# Patient Record
Sex: Female | Born: 1945
Health system: Southern US, Community
[De-identification: ages and names within clinical notes are randomized; demographics above are authoritative.]

## PROBLEM LIST (undated history)

## (undated) DIAGNOSIS — D649 Anemia, unspecified: Secondary | ICD-10-CM

## (undated) DIAGNOSIS — M1990 Unspecified osteoarthritis, unspecified site: Secondary | ICD-10-CM

## (undated) DIAGNOSIS — E785 Hyperlipidemia, unspecified: Secondary | ICD-10-CM

## (undated) DIAGNOSIS — D126 Benign neoplasm of colon, unspecified: Secondary | ICD-10-CM

## (undated) HISTORY — PX: TUBAL LIGATION: SHX77

## (undated) HISTORY — DX: Benign neoplasm of colon, unspecified: D12.6

## (undated) HISTORY — DX: Anemia, unspecified: D64.9

## (undated) HISTORY — PX: WISDOM TOOTH EXTRACTION: SHX21

## (undated) HISTORY — DX: Hyperlipidemia, unspecified: E78.5

---

## 1998-08-03 ENCOUNTER — Other Ambulatory Visit: Admission: RE | Admit: 1998-08-03 | Discharge: 1998-08-03 | Payer: Self-pay | Admitting: Obstetrics and Gynecology

## 1998-10-15 ENCOUNTER — Ambulatory Visit (HOSPITAL_COMMUNITY): Admission: RE | Admit: 1998-10-15 | Discharge: 1998-10-15 | Payer: Self-pay | Admitting: Obstetrics and Gynecology

## 1998-10-15 ENCOUNTER — Encounter: Payer: Self-pay | Admitting: Obstetrics and Gynecology

## 1999-08-24 ENCOUNTER — Other Ambulatory Visit: Admission: RE | Admit: 1999-08-24 | Discharge: 1999-08-24 | Payer: Self-pay | Admitting: Obstetrics and Gynecology

## 1999-10-18 ENCOUNTER — Encounter: Payer: Self-pay | Admitting: Obstetrics and Gynecology

## 1999-10-18 ENCOUNTER — Ambulatory Visit (HOSPITAL_COMMUNITY): Admission: RE | Admit: 1999-10-18 | Discharge: 1999-10-18 | Payer: Self-pay | Admitting: Obstetrics and Gynecology

## 2000-08-25 ENCOUNTER — Other Ambulatory Visit: Admission: RE | Admit: 2000-08-25 | Discharge: 2000-08-25 | Payer: Self-pay | Admitting: Obstetrics and Gynecology

## 2000-10-19 ENCOUNTER — Encounter: Payer: Self-pay | Admitting: Obstetrics and Gynecology

## 2000-10-19 ENCOUNTER — Ambulatory Visit (HOSPITAL_COMMUNITY): Admission: RE | Admit: 2000-10-19 | Discharge: 2000-10-19 | Payer: Self-pay | Admitting: Obstetrics and Gynecology

## 2001-08-27 ENCOUNTER — Other Ambulatory Visit: Admission: RE | Admit: 2001-08-27 | Discharge: 2001-08-27 | Payer: Self-pay | Admitting: Obstetrics and Gynecology

## 2001-10-22 ENCOUNTER — Ambulatory Visit (HOSPITAL_COMMUNITY): Admission: RE | Admit: 2001-10-22 | Discharge: 2001-10-22 | Payer: Self-pay | Admitting: Obstetrics and Gynecology

## 2001-10-22 ENCOUNTER — Encounter: Payer: Self-pay | Admitting: Obstetrics and Gynecology

## 2002-08-29 ENCOUNTER — Other Ambulatory Visit: Admission: RE | Admit: 2002-08-29 | Discharge: 2002-08-29 | Payer: Self-pay | Admitting: Obstetrics and Gynecology

## 2002-10-25 ENCOUNTER — Encounter: Payer: Self-pay | Admitting: Obstetrics and Gynecology

## 2002-10-25 ENCOUNTER — Ambulatory Visit (HOSPITAL_COMMUNITY): Admission: RE | Admit: 2002-10-25 | Discharge: 2002-10-25 | Payer: Self-pay | Admitting: Obstetrics and Gynecology

## 2003-09-09 ENCOUNTER — Other Ambulatory Visit: Admission: RE | Admit: 2003-09-09 | Discharge: 2003-09-09 | Payer: Self-pay | Admitting: Obstetrics and Gynecology

## 2003-10-27 ENCOUNTER — Ambulatory Visit (HOSPITAL_COMMUNITY): Admission: RE | Admit: 2003-10-27 | Discharge: 2003-10-27 | Payer: Self-pay | Admitting: Obstetrics and Gynecology

## 2003-11-21 ENCOUNTER — Ambulatory Visit (HOSPITAL_COMMUNITY): Admission: RE | Admit: 2003-11-21 | Discharge: 2003-11-21 | Payer: Self-pay | Admitting: Obstetrics and Gynecology

## 2004-07-14 ENCOUNTER — Ambulatory Visit (HOSPITAL_COMMUNITY): Admission: RE | Admit: 2004-07-14 | Discharge: 2004-07-14 | Payer: Self-pay | Admitting: Family Medicine

## 2004-09-09 ENCOUNTER — Other Ambulatory Visit: Admission: RE | Admit: 2004-09-09 | Discharge: 2004-09-09 | Payer: Self-pay | Admitting: Obstetrics and Gynecology

## 2004-10-27 ENCOUNTER — Ambulatory Visit (HOSPITAL_COMMUNITY): Admission: RE | Admit: 2004-10-27 | Discharge: 2004-10-27 | Payer: Self-pay | Admitting: Obstetrics and Gynecology

## 2005-09-13 ENCOUNTER — Other Ambulatory Visit: Admission: RE | Admit: 2005-09-13 | Discharge: 2005-09-13 | Payer: Self-pay | Admitting: Obstetrics and Gynecology

## 2005-11-08 ENCOUNTER — Ambulatory Visit (HOSPITAL_COMMUNITY): Admission: RE | Admit: 2005-11-08 | Discharge: 2005-11-08 | Payer: Self-pay | Admitting: Obstetrics and Gynecology

## 2006-09-14 ENCOUNTER — Other Ambulatory Visit: Admission: RE | Admit: 2006-09-14 | Discharge: 2006-09-14 | Payer: Self-pay | Admitting: Obstetrics and Gynecology

## 2006-11-21 ENCOUNTER — Ambulatory Visit (HOSPITAL_COMMUNITY): Admission: RE | Admit: 2006-11-21 | Discharge: 2006-11-21 | Payer: Self-pay | Admitting: Obstetrics and Gynecology

## 2006-12-01 ENCOUNTER — Encounter: Admission: RE | Admit: 2006-12-01 | Discharge: 2006-12-01 | Payer: Self-pay | Admitting: Obstetrics and Gynecology

## 2007-10-23 ENCOUNTER — Other Ambulatory Visit: Admission: RE | Admit: 2007-10-23 | Discharge: 2007-10-23 | Payer: Self-pay | Admitting: Obstetrics and Gynecology

## 2007-12-14 ENCOUNTER — Encounter: Admission: RE | Admit: 2007-12-14 | Discharge: 2007-12-14 | Payer: Self-pay | Admitting: Obstetrics and Gynecology

## 2007-12-28 ENCOUNTER — Ambulatory Visit (HOSPITAL_COMMUNITY): Admission: RE | Admit: 2007-12-28 | Discharge: 2007-12-28 | Payer: Self-pay | Admitting: Obstetrics and Gynecology

## 2008-11-04 ENCOUNTER — Encounter: Payer: Self-pay | Admitting: Obstetrics and Gynecology

## 2008-11-04 ENCOUNTER — Other Ambulatory Visit: Admission: RE | Admit: 2008-11-04 | Discharge: 2008-11-04 | Payer: Self-pay | Admitting: Obstetrics and Gynecology

## 2008-11-04 ENCOUNTER — Ambulatory Visit: Payer: Self-pay | Admitting: Obstetrics and Gynecology

## 2008-12-26 ENCOUNTER — Encounter: Admission: RE | Admit: 2008-12-26 | Discharge: 2008-12-26 | Payer: Self-pay | Admitting: Obstetrics and Gynecology

## 2009-01-09 ENCOUNTER — Encounter: Admission: RE | Admit: 2009-01-09 | Discharge: 2009-01-09 | Payer: Self-pay | Admitting: Obstetrics and Gynecology

## 2009-06-30 ENCOUNTER — Ambulatory Visit (HOSPITAL_COMMUNITY): Admission: RE | Admit: 2009-06-30 | Discharge: 2009-06-30 | Payer: Self-pay | Admitting: Gastroenterology

## 2009-07-10 ENCOUNTER — Encounter: Admission: RE | Admit: 2009-07-10 | Discharge: 2009-07-10 | Payer: Self-pay | Admitting: Obstetrics and Gynecology

## 2009-11-17 ENCOUNTER — Ambulatory Visit: Payer: Self-pay | Admitting: Obstetrics and Gynecology

## 2009-11-17 ENCOUNTER — Other Ambulatory Visit: Admission: RE | Admit: 2009-11-17 | Discharge: 2009-11-17 | Payer: Self-pay | Admitting: Obstetrics and Gynecology

## 2009-12-15 ENCOUNTER — Encounter: Admission: RE | Admit: 2009-12-15 | Discharge: 2009-12-15 | Payer: Self-pay | Admitting: Obstetrics and Gynecology

## 2010-01-07 ENCOUNTER — Ambulatory Visit (HOSPITAL_COMMUNITY): Admission: RE | Admit: 2010-01-07 | Discharge: 2010-01-07 | Payer: Self-pay | Admitting: Obstetrics and Gynecology

## 2010-08-22 ENCOUNTER — Encounter: Payer: Self-pay | Admitting: Obstetrics and Gynecology

## 2010-08-23 ENCOUNTER — Encounter: Payer: Self-pay | Admitting: Obstetrics and Gynecology

## 2010-11-30 ENCOUNTER — Other Ambulatory Visit: Payer: Self-pay | Admitting: Obstetrics and Gynecology

## 2010-11-30 ENCOUNTER — Encounter: Payer: Self-pay | Admitting: Obstetrics and Gynecology

## 2010-11-30 DIAGNOSIS — Z1231 Encounter for screening mammogram for malignant neoplasm of breast: Secondary | ICD-10-CM

## 2010-12-20 ENCOUNTER — Ambulatory Visit
Admission: RE | Admit: 2010-12-20 | Discharge: 2010-12-20 | Disposition: A | Discharge status: Home / Self Care | Payer: 59 | Source: Ambulatory Visit | Attending: Obstetrics and Gynecology | Admitting: Obstetrics and Gynecology

## 2010-12-20 DIAGNOSIS — Z1231 Encounter for screening mammogram for malignant neoplasm of breast: Secondary | ICD-10-CM

## 2010-12-28 ENCOUNTER — Encounter (INDEPENDENT_AMBULATORY_CARE_PROVIDER_SITE_OTHER): Payer: 59 | Admitting: Obstetrics and Gynecology

## 2010-12-28 ENCOUNTER — Other Ambulatory Visit: Payer: Self-pay | Admitting: Obstetrics and Gynecology

## 2010-12-28 ENCOUNTER — Other Ambulatory Visit (HOSPITAL_COMMUNITY)
Admission: RE | Admit: 2010-12-28 | Discharge: 2010-12-28 | Disposition: A | Discharge status: Home / Self Care | Payer: 59 | Source: Ambulatory Visit | Attending: Obstetrics and Gynecology | Admitting: Obstetrics and Gynecology

## 2010-12-28 DIAGNOSIS — Z01419 Encounter for gynecological examination (general) (routine) without abnormal findings: Secondary | ICD-10-CM

## 2010-12-28 DIAGNOSIS — Z124 Encounter for screening for malignant neoplasm of cervix: Secondary | ICD-10-CM | POA: Insufficient documentation

## 2010-12-28 DIAGNOSIS — Z13220 Encounter for screening for lipoid disorders: Secondary | ICD-10-CM

## 2011-11-28 ENCOUNTER — Other Ambulatory Visit: Payer: Self-pay | Admitting: Obstetrics and Gynecology

## 2011-11-28 DIAGNOSIS — Z1231 Encounter for screening mammogram for malignant neoplasm of breast: Secondary | ICD-10-CM

## 2011-12-22 ENCOUNTER — Encounter: Payer: Self-pay | Admitting: Gynecology

## 2011-12-22 ENCOUNTER — Ambulatory Visit
Admission: RE | Admit: 2011-12-22 | Discharge: 2011-12-22 | Disposition: A | Discharge status: Home / Self Care | Payer: Medicare Other | Source: Ambulatory Visit | Attending: Obstetrics and Gynecology | Admitting: Obstetrics and Gynecology

## 2011-12-22 DIAGNOSIS — Z1231 Encounter for screening mammogram for malignant neoplasm of breast: Secondary | ICD-10-CM

## 2011-12-29 ENCOUNTER — Encounter: Payer: 59 | Admitting: Obstetrics and Gynecology

## 2011-12-30 ENCOUNTER — Encounter: Payer: Self-pay | Admitting: Obstetrics and Gynecology

## 2011-12-30 ENCOUNTER — Ambulatory Visit (INDEPENDENT_AMBULATORY_CARE_PROVIDER_SITE_OTHER): Payer: Medicare Other | Admitting: Obstetrics and Gynecology

## 2011-12-30 VITALS — BP 116/74 | Ht 61.0 in | Wt 106.0 lb

## 2011-12-30 DIAGNOSIS — Z780 Asymptomatic menopausal state: Secondary | ICD-10-CM

## 2011-12-30 DIAGNOSIS — R350 Frequency of micturition: Secondary | ICD-10-CM

## 2011-12-30 DIAGNOSIS — M8580 Other specified disorders of bone density and structure, unspecified site: Secondary | ICD-10-CM

## 2011-12-30 DIAGNOSIS — N952 Postmenopausal atrophic vaginitis: Secondary | ICD-10-CM

## 2011-12-30 DIAGNOSIS — M899 Disorder of bone, unspecified: Secondary | ICD-10-CM

## 2011-12-30 NOTE — Progress Notes (Signed)
Patient came back to see me today for further followup. For years we had her on HRT. She's been off her HRT and does have some mild symptoms but feels that she tolerate them without treatment. She is having no vaginal bleeding. She does have some mild vaginal dryness. She is having no pelvic pain. She just a urinary frequency without nocturia, urgency, or incontinence. She is up-to-date on mammograms. Her last bone density showed extremely mild osteopenia with her worst T score minus 1.3. She has had no fractures. She takes calcium and vitamin D. She has always had normal Pap smears.  ROS: 12 system review done. Pertinent positives above. No other positives.  Physical examination: Kennon Portela present. HEENT within normal limits. Neck: Thyroid not large. No masses. Supraclavicular nodes: not enlarged. Breasts: Examined in both sitting and lying  position. No skin changes and no masses. Abdomen: Soft no guarding rebound or masses or hernia. Pelvic: External: Within normal limits. BUS: Within normal limits. Vaginal:within normal limits. Poor estrogen effect. No evidence of cystocele rectocele or enterocele. Cervix: clean. Uterus: Normal size and shape. Adnexa: No masses. Rectovaginal exam: Confirmatory and negative. Extremities: Within normal limits.  Assessment: #1. Menopausal symptoms #2. Atrophic vaginitis #3. Urinary frequency #4. Low bone mass  Plan: Continue yearly mammograms. Continue periodic bone densities. Lab through PCP.

## 2012-12-10 ENCOUNTER — Other Ambulatory Visit: Payer: Self-pay

## 2012-12-10 DIAGNOSIS — Z1231 Encounter for screening mammogram for malignant neoplasm of breast: Secondary | ICD-10-CM

## 2013-01-02 ENCOUNTER — Ambulatory Visit
Admission: RE | Admit: 2013-01-02 | Discharge: 2013-01-02 | Disposition: A | Discharge status: Home / Self Care | Payer: Medicare Other | Source: Ambulatory Visit

## 2013-01-02 DIAGNOSIS — Z1231 Encounter for screening mammogram for malignant neoplasm of breast: Secondary | ICD-10-CM

## 2013-12-13 ENCOUNTER — Other Ambulatory Visit: Payer: Self-pay

## 2013-12-13 DIAGNOSIS — Z1231 Encounter for screening mammogram for malignant neoplasm of breast: Secondary | ICD-10-CM

## 2014-01-20 ENCOUNTER — Ambulatory Visit
Admission: RE | Admit: 2014-01-20 | Discharge: 2014-01-20 | Disposition: A | Discharge status: Home / Self Care | Payer: Commercial Managed Care - HMO | Source: Ambulatory Visit

## 2014-01-20 DIAGNOSIS — Z1231 Encounter for screening mammogram for malignant neoplasm of breast: Secondary | ICD-10-CM

## 2014-06-02 ENCOUNTER — Encounter: Payer: Self-pay | Admitting: Obstetrics and Gynecology

## 2014-12-22 ENCOUNTER — Other Ambulatory Visit: Payer: Self-pay

## 2014-12-22 DIAGNOSIS — Z1231 Encounter for screening mammogram for malignant neoplasm of breast: Secondary | ICD-10-CM

## 2015-01-26 ENCOUNTER — Ambulatory Visit
Admission: RE | Admit: 2015-01-26 | Discharge: 2015-01-26 | Disposition: A | Discharge status: Home / Self Care | Payer: Commercial Managed Care - HMO | Source: Ambulatory Visit

## 2015-01-26 DIAGNOSIS — Z1231 Encounter for screening mammogram for malignant neoplasm of breast: Secondary | ICD-10-CM

## 2015-04-21 ENCOUNTER — Other Ambulatory Visit (HOSPITAL_COMMUNITY)
Admission: RE | Admit: 2015-04-21 | Discharge: 2015-04-21 | Disposition: A | Discharge status: Home / Self Care | Payer: Commercial Managed Care - HMO | Source: Ambulatory Visit | Attending: Family Medicine | Admitting: Family Medicine

## 2015-04-21 ENCOUNTER — Other Ambulatory Visit: Payer: Self-pay | Admitting: Family Medicine

## 2015-04-21 DIAGNOSIS — Z124 Encounter for screening for malignant neoplasm of cervix: Secondary | ICD-10-CM | POA: Diagnosis present

## 2015-04-24 LAB — CYTOLOGY - PAP

## 2016-01-04 ENCOUNTER — Other Ambulatory Visit: Payer: Self-pay | Admitting: Family Medicine

## 2016-01-04 DIAGNOSIS — Z1231 Encounter for screening mammogram for malignant neoplasm of breast: Secondary | ICD-10-CM

## 2016-02-04 ENCOUNTER — Ambulatory Visit
Admission: RE | Admit: 2016-02-04 | Discharge: 2016-02-04 | Disposition: A | Discharge status: Home / Self Care | Payer: Commercial Managed Care - HMO | Source: Ambulatory Visit | Attending: Family Medicine | Admitting: Family Medicine

## 2016-02-04 DIAGNOSIS — Z1231 Encounter for screening mammogram for malignant neoplasm of breast: Secondary | ICD-10-CM

## 2016-02-08 ENCOUNTER — Other Ambulatory Visit: Payer: Self-pay | Admitting: Family Medicine

## 2016-02-08 DIAGNOSIS — R928 Other abnormal and inconclusive findings on diagnostic imaging of breast: Secondary | ICD-10-CM

## 2016-02-16 ENCOUNTER — Ambulatory Visit
Admission: RE | Admit: 2016-02-16 | Discharge: 2016-02-16 | Disposition: A | Discharge status: Home / Self Care | Payer: Commercial Managed Care - HMO | Source: Ambulatory Visit | Attending: Family Medicine | Admitting: Family Medicine

## 2016-02-16 DIAGNOSIS — R928 Other abnormal and inconclusive findings on diagnostic imaging of breast: Secondary | ICD-10-CM

## 2016-12-12 DIAGNOSIS — Z681 Body mass index (BMI) 19 or less, adult: Secondary | ICD-10-CM | POA: Diagnosis not present

## 2016-12-12 DIAGNOSIS — E785 Hyperlipidemia, unspecified: Secondary | ICD-10-CM | POA: Diagnosis not present

## 2017-01-31 ENCOUNTER — Other Ambulatory Visit: Payer: Self-pay | Admitting: Family Medicine

## 2017-01-31 DIAGNOSIS — Z1231 Encounter for screening mammogram for malignant neoplasm of breast: Secondary | ICD-10-CM

## 2017-02-09 ENCOUNTER — Ambulatory Visit
Admission: RE | Admit: 2017-02-09 | Discharge: 2017-02-09 | Disposition: A | Discharge status: Home / Self Care | Payer: Medicare PPO | Source: Ambulatory Visit | Attending: Family Medicine | Admitting: Family Medicine

## 2017-02-09 DIAGNOSIS — Z1231 Encounter for screening mammogram for malignant neoplasm of breast: Secondary | ICD-10-CM

## 2017-07-28 DIAGNOSIS — M25571 Pain in right ankle and joints of right foot: Secondary | ICD-10-CM | POA: Diagnosis not present

## 2017-07-28 DIAGNOSIS — M25572 Pain in left ankle and joints of left foot: Secondary | ICD-10-CM | POA: Diagnosis not present

## 2017-07-28 DIAGNOSIS — M545 Low back pain: Secondary | ICD-10-CM | POA: Diagnosis not present

## 2017-07-28 DIAGNOSIS — M19042 Primary osteoarthritis, left hand: Secondary | ICD-10-CM | POA: Diagnosis not present

## 2017-07-28 DIAGNOSIS — M532X7 Spinal instabilities, lumbosacral region: Secondary | ICD-10-CM | POA: Diagnosis not present

## 2018-01-08 ENCOUNTER — Other Ambulatory Visit: Payer: Self-pay | Admitting: Family Medicine

## 2018-01-08 DIAGNOSIS — Z1231 Encounter for screening mammogram for malignant neoplasm of breast: Secondary | ICD-10-CM

## 2018-02-13 ENCOUNTER — Ambulatory Visit
Admission: RE | Admit: 2018-02-13 | Discharge: 2018-02-13 | Disposition: A | Discharge status: Home / Self Care | Payer: Medicare PPO | Source: Ambulatory Visit | Attending: Family Medicine | Admitting: Family Medicine

## 2018-02-13 DIAGNOSIS — Z1231 Encounter for screening mammogram for malignant neoplasm of breast: Secondary | ICD-10-CM | POA: Diagnosis not present

## 2018-02-21 DIAGNOSIS — M810 Age-related osteoporosis without current pathological fracture: Secondary | ICD-10-CM | POA: Diagnosis not present

## 2018-02-21 DIAGNOSIS — H9190 Unspecified hearing loss, unspecified ear: Secondary | ICD-10-CM | POA: Diagnosis not present

## 2018-02-21 DIAGNOSIS — D509 Iron deficiency anemia, unspecified: Secondary | ICD-10-CM | POA: Diagnosis not present

## 2018-02-21 DIAGNOSIS — E785 Hyperlipidemia, unspecified: Secondary | ICD-10-CM | POA: Diagnosis not present

## 2018-03-15 DIAGNOSIS — E7800 Pure hypercholesterolemia, unspecified: Secondary | ICD-10-CM | POA: Diagnosis not present

## 2018-03-15 DIAGNOSIS — M545 Low back pain: Secondary | ICD-10-CM | POA: Diagnosis not present

## 2018-03-15 DIAGNOSIS — M8580 Other specified disorders of bone density and structure, unspecified site: Secondary | ICD-10-CM | POA: Diagnosis not present

## 2018-04-03 DIAGNOSIS — E871 Hypo-osmolality and hyponatremia: Secondary | ICD-10-CM | POA: Diagnosis not present

## 2018-11-29 DIAGNOSIS — M810 Age-related osteoporosis without current pathological fracture: Secondary | ICD-10-CM | POA: Diagnosis not present

## 2018-11-29 DIAGNOSIS — H269 Unspecified cataract: Secondary | ICD-10-CM | POA: Diagnosis not present

## 2018-11-29 DIAGNOSIS — H9190 Unspecified hearing loss, unspecified ear: Secondary | ICD-10-CM | POA: Diagnosis not present

## 2018-11-29 DIAGNOSIS — M1711 Unilateral primary osteoarthritis, right knee: Secondary | ICD-10-CM | POA: Diagnosis not present

## 2018-11-29 DIAGNOSIS — F324 Major depressive disorder, single episode, in partial remission: Secondary | ICD-10-CM | POA: Diagnosis not present

## 2018-11-29 DIAGNOSIS — E785 Hyperlipidemia, unspecified: Secondary | ICD-10-CM | POA: Diagnosis not present

## 2018-11-29 DIAGNOSIS — D509 Iron deficiency anemia, unspecified: Secondary | ICD-10-CM | POA: Diagnosis not present

## 2019-01-09 ENCOUNTER — Other Ambulatory Visit: Payer: Self-pay | Admitting: Family Medicine

## 2019-01-09 DIAGNOSIS — Z1231 Encounter for screening mammogram for malignant neoplasm of breast: Secondary | ICD-10-CM

## 2019-02-27 ENCOUNTER — Ambulatory Visit: Payer: Medicare PPO

## 2019-03-19 DIAGNOSIS — M8580 Other specified disorders of bone density and structure, unspecified site: Secondary | ICD-10-CM | POA: Diagnosis not present

## 2019-03-19 DIAGNOSIS — Z1159 Encounter for screening for other viral diseases: Secondary | ICD-10-CM | POA: Diagnosis not present

## 2019-03-19 DIAGNOSIS — E2839 Other primary ovarian failure: Secondary | ICD-10-CM | POA: Diagnosis not present

## 2019-03-19 DIAGNOSIS — Z0000 Encounter for general adult medical examination without abnormal findings: Secondary | ICD-10-CM | POA: Diagnosis not present

## 2019-03-19 DIAGNOSIS — E7800 Pure hypercholesterolemia, unspecified: Secondary | ICD-10-CM | POA: Diagnosis not present

## 2019-03-19 DIAGNOSIS — Z1389 Encounter for screening for other disorder: Secondary | ICD-10-CM | POA: Diagnosis not present

## 2019-03-19 DIAGNOSIS — Z1211 Encounter for screening for malignant neoplasm of colon: Secondary | ICD-10-CM | POA: Diagnosis not present

## 2019-03-22 ENCOUNTER — Other Ambulatory Visit: Payer: Self-pay | Admitting: Family Medicine

## 2019-03-22 DIAGNOSIS — E2839 Other primary ovarian failure: Secondary | ICD-10-CM

## 2019-05-07 ENCOUNTER — Ambulatory Visit
Admission: RE | Admit: 2019-05-07 | Discharge: 2019-05-07 | Disposition: A | Discharge status: Home / Self Care | Payer: Medicare PPO | Source: Ambulatory Visit | Attending: Family Medicine | Admitting: Family Medicine

## 2019-05-07 ENCOUNTER — Other Ambulatory Visit: Payer: Self-pay

## 2019-05-07 DIAGNOSIS — Z1231 Encounter for screening mammogram for malignant neoplasm of breast: Secondary | ICD-10-CM | POA: Diagnosis not present

## 2019-05-08 ENCOUNTER — Other Ambulatory Visit: Payer: Self-pay | Admitting: Family Medicine

## 2019-05-08 DIAGNOSIS — R928 Other abnormal and inconclusive findings on diagnostic imaging of breast: Secondary | ICD-10-CM

## 2019-05-10 ENCOUNTER — Ambulatory Visit
Admission: RE | Admit: 2019-05-10 | Discharge: 2019-05-10 | Disposition: A | Discharge status: Home / Self Care | Payer: Medicare PPO | Source: Ambulatory Visit | Attending: Family Medicine | Admitting: Family Medicine

## 2019-05-10 ENCOUNTER — Other Ambulatory Visit: Payer: Self-pay

## 2019-05-10 DIAGNOSIS — N6002 Solitary cyst of left breast: Secondary | ICD-10-CM | POA: Diagnosis not present

## 2019-05-10 DIAGNOSIS — R922 Inconclusive mammogram: Secondary | ICD-10-CM | POA: Diagnosis not present

## 2019-05-10 DIAGNOSIS — R928 Other abnormal and inconclusive findings on diagnostic imaging of breast: Secondary | ICD-10-CM

## 2019-05-10 DIAGNOSIS — N6321 Unspecified lump in the left breast, upper outer quadrant: Secondary | ICD-10-CM | POA: Diagnosis not present

## 2019-06-05 DIAGNOSIS — Z1159 Encounter for screening for other viral diseases: Secondary | ICD-10-CM | POA: Diagnosis not present

## 2019-06-05 DIAGNOSIS — E7800 Pure hypercholesterolemia, unspecified: Secondary | ICD-10-CM | POA: Diagnosis not present

## 2019-06-18 ENCOUNTER — Ambulatory Visit
Admission: RE | Admit: 2019-06-18 | Discharge: 2019-06-18 | Disposition: A | Discharge status: Home / Self Care | Payer: Medicare PPO | Source: Ambulatory Visit | Attending: Family Medicine | Admitting: Family Medicine

## 2019-06-18 ENCOUNTER — Other Ambulatory Visit: Payer: Self-pay

## 2019-06-18 DIAGNOSIS — E2839 Other primary ovarian failure: Secondary | ICD-10-CM

## 2019-06-18 DIAGNOSIS — Z780 Asymptomatic menopausal state: Secondary | ICD-10-CM | POA: Diagnosis not present

## 2019-08-02 HISTORY — PX: COLONOSCOPY: SHX174

## 2019-08-23 DIAGNOSIS — Z1159 Encounter for screening for other viral diseases: Secondary | ICD-10-CM | POA: Diagnosis not present

## 2019-08-28 DIAGNOSIS — Z1211 Encounter for screening for malignant neoplasm of colon: Secondary | ICD-10-CM | POA: Diagnosis not present

## 2019-08-28 DIAGNOSIS — D120 Benign neoplasm of cecum: Secondary | ICD-10-CM | POA: Diagnosis not present

## 2019-08-30 DIAGNOSIS — D120 Benign neoplasm of cecum: Secondary | ICD-10-CM | POA: Diagnosis not present

## 2019-09-09 DIAGNOSIS — E7800 Pure hypercholesterolemia, unspecified: Secondary | ICD-10-CM | POA: Diagnosis not present

## 2019-09-24 DIAGNOSIS — M810 Age-related osteoporosis without current pathological fracture: Secondary | ICD-10-CM | POA: Diagnosis not present

## 2019-09-24 DIAGNOSIS — E785 Hyperlipidemia, unspecified: Secondary | ICD-10-CM | POA: Diagnosis not present

## 2019-09-24 DIAGNOSIS — H9193 Unspecified hearing loss, bilateral: Secondary | ICD-10-CM | POA: Diagnosis not present

## 2019-09-24 DIAGNOSIS — M170 Bilateral primary osteoarthritis of knee: Secondary | ICD-10-CM | POA: Diagnosis not present

## 2019-09-26 ENCOUNTER — Ambulatory Visit: Payer: Medicare PPO | Attending: Internal Medicine

## 2019-09-26 DIAGNOSIS — Z23 Encounter for immunization: Secondary | ICD-10-CM

## 2019-09-26 NOTE — Progress Notes (Signed)
   Covid-19 Vaccination Clinic  Name:  Ashley Gonzalez    MRN: [id number] DOB: [birth year]  09/26/2019  [name] was observed post Covid-19 immunization for 15 minutes without incidence. She was provided with Vaccine Information Sheet and instruction to access the V-Safe system.   [name] was instructed to call 911 with any severe reactions post vaccine: Marland Kitchen Difficulty breathing  . Swelling of your face and throat  . A fast heartbeat  . A bad rash all over your body  . Dizziness and weakness    Immunizations Administered    Name Date Dose VIS Date Route   Pfizer COVID-19 Vaccine 09/26/2019  9:44 AM 0.3 mL 07/12/2019 Intramuscular   Manufacturer: [place]   Lot: [id number]   [place]: [id number]

## 2019-10-18 ENCOUNTER — Telehealth: Payer: Self-pay | Admitting: Gastroenterology

## 2019-10-18 NOTE — Telephone Encounter (Signed)
Dr. Rush Landmark, pt was referred to you by Dr. Cristina Gong for removal of 4 cm cecal polyp.  Pt's records were printed from [place] and will be sent to you for review.

## 2019-10-21 NOTE — Telephone Encounter (Signed)
Will review when I return from scheduled leave.

## 2019-10-22 ENCOUNTER — Ambulatory Visit: Payer: Medicare PPO | Attending: Internal Medicine

## 2019-10-22 DIAGNOSIS — Z23 Encounter for immunization: Secondary | ICD-10-CM

## 2019-10-22 NOTE — Progress Notes (Signed)
   Covid-19 Vaccination Clinic  Name:  Ashley Gonzalez    MRN: [id number] DOB: September 20, 1945  10/22/2019  [name] was observed post Covid-19 immunization for 15 minutes without incident. She was provided with Vaccine Information Sheet and instruction to access the V-Safe system.   [name] was instructed to call 911 with any severe reactions post vaccine: Marland Kitchen Difficulty breathing  . Swelling of face and throat  . A fast heartbeat  . A bad rash all over body  . Dizziness and weakness   Immunizations Administered    Name Date Dose VIS Date Route   Pfizer COVID-19 Vaccine 10/22/2019  8:57 AM 0.3 mL 07/12/2019 Intramuscular   Manufacturer: [place]   Lot: [id number]   [place]: [id number]

## 2019-10-23 NOTE — Telephone Encounter (Signed)
I have had an opportunity to review the records that were in the chart.  I am able to read Dr. Osborn Coho note.  I do not have access to the patient's colonoscopy report however. But I am okay with moving forward with a clinic visit to discuss colon with advanced polyp resection. R[place], please work on scheduling this.  Okay to overbook at 1150 or 350 slot for next available clinic visit as needed. Please also work on obtaining the colonoscopy report as well as the pathology. Please let myself and Dr. Cristina Gong know once the patient has a clinic visit set up. Thanks. GM

## 2019-11-11 NOTE — Telephone Encounter (Addendum)
Pt scheduled for OV 12/12/19 @ 1:30pm to discuss Colon+EMR

## 2019-11-11 NOTE — Telephone Encounter (Signed)
Thank you for update. If no earlier 350PM slots work for her and that is the date that works for her, I am good with that. Thanks. GM

## 2019-12-12 ENCOUNTER — Ambulatory Visit: Payer: Medicare PPO | Admitting: Gastroenterology

## 2019-12-12 ENCOUNTER — Encounter: Payer: Self-pay | Admitting: Gastroenterology

## 2019-12-12 ENCOUNTER — Other Ambulatory Visit (INDEPENDENT_AMBULATORY_CARE_PROVIDER_SITE_OTHER): Payer: Medicare PPO

## 2019-12-12 VITALS — BP 124/70 | HR 76 | Temp 97.9°F | Ht 61.0 in | Wt 107.2 lb

## 2019-12-12 DIAGNOSIS — K635 Polyp of colon: Secondary | ICD-10-CM | POA: Diagnosis not present

## 2019-12-12 DIAGNOSIS — R933 Abnormal findings on diagnostic imaging of other parts of digestive tract: Secondary | ICD-10-CM | POA: Diagnosis not present

## 2019-12-12 DIAGNOSIS — D126 Benign neoplasm of colon, unspecified: Secondary | ICD-10-CM | POA: Diagnosis not present

## 2019-12-12 LAB — BASIC METABOLIC PANEL
BUN: 17 mg/dL (ref 6–23)
CO2: 30 mEq/L (ref 19–32)
Calcium: 10.3 mg/dL (ref 8.4–10.5)
Chloride: 104 mEq/L (ref 96–112)
Creatinine, Ser: 0.63 mg/dL (ref 0.40–1.20)
GFR: 111.55 mL/min (ref >60.00)
Glucose, Bld: 114 mg/dL — ABNORMAL HIGH (ref 70–99)
Potassium: 4 mEq/L (ref 3.5–5.1)
Sodium: 139 mEq/L (ref 135–145)

## 2019-12-12 LAB — CBC
HCT: 40.2 % (ref 36.0–46.0)
Hemoglobin: 13.5 g/dL (ref 12.0–15.0)
MCHC: 33.6 g/dL (ref 30.0–36.0)
MCV: 89.5 fl (ref 78.0–100.0)
Platelets: 295 10*3/uL (ref 150.0–400.0)
RBC: 4.49 Mil/uL (ref 3.87–5.11)
RDW: 13.2 % (ref 11.5–15.5)
WBC: 6.4 10*3/uL (ref 4.0–10.5)

## 2019-12-12 LAB — PROTIME-INR
INR: 1 ratio (ref 0.8–1.0)
Prothrombin Time: 11.2 s (ref 9.6–13.1)

## 2019-12-12 MED ORDER — SUPREP BOWEL PREP KIT 17.5-3.13-1.6 GM/177ML PO SOLN: 1.0000 | ORAL | 0 refills | Status: DC

## 2019-12-12 MED ORDER — SUPREP BOWEL PREP KIT 17.5-3.13-1.6 GM/177ML PO SOLN
1.0000 | ORAL | 0 refills | Status: DC
Start: 2019-12-12 — End: 2020-02-11

## 2019-12-12 NOTE — Patient Instructions (Addendum)
You have been scheduled for a colonoscopy. Please follow written instructions given to you at your visit today.  Please pick up your prep supplies at the pharmacy within the next 1-3 days. If you use inhalers (even only as needed), please bring them with you on the day of your procedure.  Due to recent changes in healthcare laws, you may see the results of your imaging and laboratory studies on MyChart before your provider has had a chance to review them.  We understand that in some cases there may be results that are confusing or concerning to you. Not all laboratory results come back in the same time frame and the provider may be waiting for multiple results in order to interpret others.  Please give [place] 48 hours in order for your provider to thoroughly review all the results before contacting the office for clarification of your results.   If you are age 75 or older, your body mass index should be between 23-30. Your Body mass index is 20.26 kg/m. If this is out of the aforementioned range listed, please consider follow up with your Primary Care Provider.  If you are age 99 or younger, your body mass index should be between 19-25. Your Body mass index is 20.26 kg/m. If this is out of the aformentioned range listed, please consider follow up with your Primary Care Provider.    Your provider has requested that you go to the basement level for lab work before leaving today. Press "B" on the elevator. The lab is located at the first door on the left as you exit the elevator.   We have sent the following medications to your pharmacy for you to pick up at your convenience: Suprep   Thank you for choosing me and [facility].  Dr. Rush Landmark

## 2019-12-13 ENCOUNTER — Telehealth: Payer: Self-pay | Admitting: Gastroenterology

## 2019-12-13 DIAGNOSIS — D126 Benign neoplasm of colon, unspecified: Secondary | ICD-10-CM

## 2019-12-13 DIAGNOSIS — R933 Abnormal findings on diagnostic imaging of other parts of digestive tract: Secondary | ICD-10-CM

## 2019-12-13 DIAGNOSIS — K635 Polyp of colon: Secondary | ICD-10-CM

## 2019-12-14 ENCOUNTER — Encounter: Payer: Self-pay | Admitting: Gastroenterology

## 2019-12-14 DIAGNOSIS — K635 Polyp of colon: Secondary | ICD-10-CM | POA: Insufficient documentation

## 2019-12-14 DIAGNOSIS — R933 Abnormal findings on diagnostic imaging of other parts of digestive tract: Secondary | ICD-10-CM | POA: Insufficient documentation

## 2019-12-14 NOTE — Progress Notes (Signed)
GASTROENTEROLOGY OUTPATIENT CLINIC VISIT   Primary Care Provider Maury Dus, MD [address] [place] [address] [phone]  Referring Provider Dr. Cristina Gong  Patient Profile: Ashley Gonzalez is a 74 y.o. female with a pmh significant for hyperlipidemia, osteopenia/osteoporosis, cecal colon adenoma.  The patient presents to the [facility] for an evaluation and management of problem(s) noted below:  Problem List 1. Cecal polyp   2. Tubular adenoma of colon   3. Abnormal colonoscopy     History of Present Illness This is the patient's first visit to the outpatient [place] clinic.  The patient underwent a screening colonoscopy on 27 January by Dr. Cristina Gong of [facility] gastroenterology.  He found a 40 mm polyp at the base of the cecum in the vicinity of the appendiceal orifice.  The lesion was sessile and multilobulated with some central depression as result of the contour of the lesion rather than central excavation.  No ulceration.  Very superficial biopsies were taken with cold forceps.  The rest of the colon was unremarkable including terminal ileal evaluation.  The pathology returned as a tubular adenoma.  It is for this reason that the patient is referred for discussion of potential advanced resection techniques.  Patient's last colonoscopy was in November 2010 and reported to be normal.  Patient has no significant GI symptoms.  She uses the restroom once to twice daily.  No blood in her stools.  No significant weight loss unintentionally.  The patient has not had an upper endoscopy previously.  Patient does not take significant nonsteroidals or BC/Goody powders.  GI Review of Systems Positive as above Negative for dysphagia, odynophagia, nausea, vomiting, pain, change in bowel habits  Review of Systems General: Denies fevers/chills HEENT: Denies oral lesions Cardiovascular: Denies chest pain/palpitations Pulmonary: Denies shortness of  breath Gastroenterological: See HPI Genitourinary: Denies darkened urine Hematological: Denies easy bruising/bleeding Dermatological: Denies jaundice Psychological: Mood is stable   Medications Current Outpatient Medications  Medication Sig Dispense Refill  . alendronate (FOSAMAX) 70 MG tablet Take 70 mg by mouth once a week. Take with a full glass of water on an empty stomach.    . Calcium Carbonate Antacid (TUMS PO) Take 600 mg by mouth daily.     . Cholecalciferol (VITAMIN D PO) Take by mouth.    . IRON PO Take 65 mcg by mouth.     . Multiple Vitamin (MULTIVITAMIN) tablet Take 1 tablet by mouth daily.    . Rosuvastatin Calcium 20 MG CPSP Take 1 capsule by mouth daily.    Marland Kitchen VITAMIN E PO Take by mouth.    . Na Sulfate-K Sulfate-Mg Sulf (SUPREP BOWEL PREP KIT) 17.5-3.13-1.6 GM/177ML SOLN Take 1 kit by mouth as directed. For colonoscopy prep Apply Coupon=BIN: [id number] PCN: CN GROUP: [id number] ID: [id number] 354 mL 0   No current facility-administered medications for this visit.    Allergies No Known Allergies  Histories Past Medical History:  Diagnosis Date  . Anemia   . Hyperlipidemia   . Tubular adenoma of colon    Past Surgical History:  Procedure Laterality Date  . TUBAL LIGATION     Social History   Socioeconomic History  . Marital status: Married    Spouse name: Not on file  . Number of children: Not on file  . Years of education: Not on file  . Highest education level: Not on file  Occupational History  . Not on file  Tobacco Use  . Smoking status: Never  Smoker  . Smokeless tobacco: Never Used  Substance and Sexual Activity  . Alcohol use: No  . Drug use: Not on file  . Sexual activity: Never    Birth control/protection: Surgical  Other Topics Concern  . Not on file  Social History Narrative  . Not on file   Social Determinants of Health   Financial Resource Strain:   . Difficulty of Paying Living Expenses:   Food Insecurity:   . Worried About  Charity fundraiser in the Last Year:   . Arboriculturist in the Last Year:   Transportation Needs:   . Film/video editor (Medical):   Marland Kitchen Lack of Transportation (Non-Medical):   Physical Activity:   . Days of Exercise per Week:   . Minutes of Exercise per Session:   Stress:   . Feeling of Stress :   Social Connections:   . Frequency of Communication with Friends and Family:   . Frequency of Social Gatherings with Friends and Family:   . Attends Religious Services:   . Active Member of Clubs or Organizations:   . Attends Archivist Meetings:   Marland Kitchen Marital Status:   Intimate Partner Violence:   . Fear of Current or Ex-Partner:   . Emotionally Abused:   Marland Kitchen Physically Abused:   . Sexually Abused:    Family History  Problem Relation Age of Onset  . Diabetes Sister   . Breast cancer Sister        Age 39  . Diabetes Brother   . Colon polyps Brother   . Diabetes Sister   . Diabetes Brother   . Breast cancer Sister   . Colon cancer Neg Hx   . Esophageal cancer Neg Hx   . Stomach cancer Neg Hx   . Pancreatic cancer Neg Hx   . Liver disease Neg Hx   . Inflammatory bowel disease Neg Hx   . Rectal cancer Neg Hx    I have reviewed her medical, social, and family history in detail and updated the electronic medical record as necessary.    PHYSICAL EXAMINATION  BP 124/70   Pulse 76   Temp 97.9 F (36.6 C)   Ht '5\' 1"'$  (1.549 m)   Wt 107 lb 3.2 oz (48.6 kg)   BMI 20.26 kg/m  Wt Readings from Last 3 Encounters:  12/12/19 107 lb 3.2 oz (48.6 kg)  12/30/11 106 lb (48.1 kg)   GEN: NAD, appears stated age, doesn't appear chronically ill PSYCH: Cooperative, without pressured speech EYE: Conjunctivae pink, sclerae anicteric ENT: MMM, without oral ulcers, no erythema or exudates noted NECK: Supple CV: RR without R/Gs  RESP: CTAB posteriorly, without wheezing GI: NABS, soft, NT/ND, without rebound or guarding, no HSM appreciated GU: DRE shows MSK/EXT: _ edema, no  palmar erythema SKIN: No jaundice, no spider angiomata, no concerning rashes NEURO:  Alert & Oriented x 3, no focal deficits, no evidence of asterixis   REVIEW OF DATA  I reviewed the following data at the time of this encounter:  GI Procedures and Studies  August 28, 2019 colonoscopy 140 mm polyp at the appendiceal orifice.  Conservatively biopsied. The examined portion of the ileum was normal. The distal rectum and anal verge were normal on retroflexion view. Pathology Tubular adenoma  Laboratory Studies  Reviewed those in epic  Imaging Studies  No relevant studies to review   ASSESSMENT  Ms. Mccarroll is a 74 y.o. female with a pmh significant for hyperlipidemia, osteopenia/osteoporosis,  cecal colon adenoma.  The patient is seen today for evaluation and management of:  1. Cecal polyp   2. Tubular adenoma of colon   3. Abnormal colonoscopy    The patient is clinically and hemodynamically stable.  Based upon the description and endoscopic pictures I do feel that it is reasonable to pursue an Advanced Polypectomy attempt of the polyp/lesion.  With that being said, there was some concern of potential inability to truly evaluate the appendiceal orifice so the removal of this could be potentially more difficult.  We discussed some of the techniques of advanced polypectomy which include Endoscopic Mucosal Resection, OVESCO Full-Thickness Resection, Endorotor Morcellation, and Tissue Ablation via Fulguration.  The risks and benefits of endoscopic evaluation were discussed with the patient; these include but are not limited to the risk of perforation, infection, bleeding, missed lesions, lack of diagnosis, severe illness requiring hospitalization, as well as anesthesia and sedation related illnesses.  During attempts at advanced resection, the risks of bleeding and perforation/leak are increased as opposed to diagnostic and screening procedures, and that was discussed with the patient as  well.   In addition, I explained that with the possible need for piecemeal resection, subsequent short-interval endoscopic evaluation for follow up and potential retreatment of the lesion/area may be necessary.  I did offer, a referral to surgery in order for patient to have opportunity to discuss surgical management/intervention prior to finalizing decision for attempt at endoscopic removal, however, the patient deferred on this.  If, after attempt at removal of the polyp/lesion, it is found that the patient has a complication or that an invasive lesion or malignant lesion is found, or that the polyp/lesion continues to recur, the patient is aware and understands that surgery may still be indicated/required.  I think, because the patient appendiceal orifice was not clearly defined, it may be of benefit to obtain a CT abdomen/pelvis in an effort of truly knowing that the appendix does not have significant obstruction at this time although the patient has no symptoms.  We will discuss this with the patient as well and plan to proceed with scheduling her procedure as soon as able.  All patient questions were answered, to the best of my ability, and the patient agrees to the aforementioned plan of action with follow-up as indicated.   PLAN  Proceed with scheduling colonoscopy with EMR Preprocedure labs as outlined below We will discuss with patient CT abdomen pelvis to be performed prior to procedure to ensure the appendix is not already infiltrated or obstructive although I suspect this will be unlikely but as a result of the patient's colonoscopy report of difficulty finding the appendiceal orifice.   Orders Placed This Encounter  Procedures  . Procedural/ Surgical Case Request: COLONOSCOPY WITH PROPOFOL, ENDOSCOPIC MUCOSAL RESECTION  . CBC  . Basic Metabolic Panel (BMET)  . INR/PT  . Ambulatory referral to Gastroenterology    New Prescriptions   NA SULFATE-K SULFATE-MG SULF (SUPREP BOWEL PREP KIT)  17.5-3.13-1.6 GM/177ML SOLN    Take 1 kit by mouth as directed. For colonoscopy prep Apply Coupon=BIN: [id number] PCN: CN GROUP: [id number] ID: [id number]   Modified Medications   No medications on file    Planned Follow Up No follow-ups on file.   Total Time in Face-to-Face and in Coordination of Care for patient including independent/personal interpretation/review of prior testing, medical history, examination, medication adjustment, communicating results with the patient directly, and documentation with the EHR is 45 minutes.   [name], MD [facility]  Advanced Endoscopy Office # [phone]

## 2019-12-15 DIAGNOSIS — D126 Benign neoplasm of colon, unspecified: Secondary | ICD-10-CM | POA: Insufficient documentation

## 2019-12-16 ENCOUNTER — Telehealth: Payer: Self-pay | Admitting: Gastroenterology

## 2019-12-16 NOTE — Telephone Encounter (Addendum)
CT scan scheduled for 12/19/19 @ [facility] 8:30- TOA 8:15am NPO 4 hrs prior to CT. Pt came by office this afternoon picked up contrast and instructed. Pt voiced understanding. Also Prep for Colonoscopy was changed to Plenvu due to ins coverage. Sample of prep and new instructions given and explained to pt.

## 2019-12-16 NOTE — Telephone Encounter (Signed)
Prep changed to Plenvu. Pt will come by office this afternoon. I will re-instruct pt and give sample of Plenvu.

## 2019-12-16 NOTE — Telephone Encounter (Signed)
-----   Message from Irving Copas., MD sent at 12/15/2019  4:15 AM EDT ----- Ashley Gonzalez and Ashley Gonzalez,As I finalize my notes this weekend I reviewed the patient's colonoscopy report a little bit more in depth.  I think obtaining a CT abdomen/pelvis with IV and oral contrast will help me prior to her planned colonoscopy with EMR.  We are not going to change her date currently.  However, obtaining the CT abdomen/pelvis will help me understand whether the appendix could already be involved.  I do not suspect this as she has not had symptoms but in the colonoscopy report, Dr. Cristina Gong notes that he cannot visualize the appendix orifice completely.  Thus, in an effort of trying to be as thoughtful and thorough before her resection attempt, a CT abdomen pelvis would be helpful.  Can you please reach out to her on Monday and described to her my thinking/thought process and get the CT scan performed in the next few weeks.  If she has any further questions, I am happy to call and talk with her.Thanks.GM

## 2019-12-16 NOTE — Telephone Encounter (Signed)
Thanks for the update. GM

## 2019-12-16 NOTE — Telephone Encounter (Signed)
Pls call pt, she would like to speak with you about copay for prep. Pt stated that is over $100.

## 2019-12-17 NOTE — Telephone Encounter (Signed)
Pt called to let you know that she is all set. No need to call her.

## 2019-12-17 NOTE — Telephone Encounter (Signed)
Patient has additional questions.

## 2019-12-19 ENCOUNTER — Telehealth: Payer: Self-pay | Admitting: Gastroenterology

## 2019-12-19 ENCOUNTER — Ambulatory Visit (HOSPITAL_COMMUNITY)
Admission: RE | Admit: 2019-12-19 | Discharge: 2019-12-19 | Disposition: A | Discharge status: Home / Self Care | Payer: Medicare PPO | Source: Ambulatory Visit | Attending: Gastroenterology | Admitting: Gastroenterology

## 2019-12-19 ENCOUNTER — Encounter (HOSPITAL_COMMUNITY): Payer: Self-pay

## 2019-12-19 ENCOUNTER — Other Ambulatory Visit: Payer: Self-pay

## 2019-12-19 DIAGNOSIS — K635 Polyp of colon: Secondary | ICD-10-CM | POA: Diagnosis not present

## 2019-12-19 DIAGNOSIS — D259 Leiomyoma of uterus, unspecified: Secondary | ICD-10-CM | POA: Diagnosis not present

## 2019-12-19 DIAGNOSIS — R933 Abnormal findings on diagnostic imaging of other parts of digestive tract: Secondary | ICD-10-CM | POA: Diagnosis not present

## 2019-12-19 DIAGNOSIS — D126 Benign neoplasm of colon, unspecified: Secondary | ICD-10-CM | POA: Insufficient documentation

## 2019-12-19 MED ORDER — SODIUM CHLORIDE (PF) 0.9 % IJ SOLN
INTRAMUSCULAR | Status: AC
  Filled 2019-12-19: qty 50

## 2019-12-19 MED ORDER — IOHEXOL 300 MG/ML  SOLN
100.0000 mL | Freq: Once | INTRAMUSCULAR | Status: AC | PRN
  Administered 2019-12-19: 100 mL via INTRAVENOUS

## 2019-12-19 NOTE — Telephone Encounter (Signed)
I called to speak with the patient about the results of her CT abdomen/pelvis today. I had ordered this CT scan to ensure in the setting of the large polyp in which the appendiceal orifice could not be evaluated by Dr. Janeece Riggers completely to make sure that the appendiceal orifice was not dilated suggesting the need for just surgical intervention. The CT scan is not showing any concerns from that standpoint. However there is a uterine/endometrial mass or polyp that is being noted. She previously saw Dr. Willey Blade at [facility] gynecology but that provider is no longer present in the group. I recommend that we move forward with a vaginal ultrasound being ordered as well as a referral to [facility] gynecology/obstetrics for further evaluation of this lesion. I do not see a contraindication to moving forward with planned colonoscopy with EMR in the coming weeks.  Patty please move forward as follows: 1) order transvaginal ultrasound for uterine/endometrial mass evaluation 2) please place urgent referral for [facility] for evaluation of abnormal CT and uterine/endometrial mass evaluation 3) print out report and forward to Dr. Osborn Coho office and Dr. Herbie Baltimore Reade's office 4) print out a report and mail to the patient  Thanks. GM

## 2019-12-19 NOTE — Telephone Encounter (Signed)
Appt made with Radene Journey at [facility] on 12/23/19 at 1110 am

## 2019-12-19 NOTE — Telephone Encounter (Signed)
The pt has been scheduled for appt with Radene Journey on 5/24.  Pt aware and address provided.  Report sent to Dr Cristina Gong and Alyson Ingles and printed and mailed to the pt.

## 2019-12-19 NOTE — Telephone Encounter (Signed)
[place]: [phone] [address]: [phone] [facility] [email or web address]

## 2019-12-23 DIAGNOSIS — N858 Other specified noninflammatory disorders of uterus: Secondary | ICD-10-CM | POA: Diagnosis not present

## 2019-12-23 DIAGNOSIS — Z01419 Encounter for gynecological examination (general) (routine) without abnormal findings: Secondary | ICD-10-CM | POA: Diagnosis not present

## 2020-01-06 DIAGNOSIS — N858 Other specified noninflammatory disorders of uterus: Secondary | ICD-10-CM | POA: Diagnosis not present

## 2020-01-06 DIAGNOSIS — D259 Leiomyoma of uterus, unspecified: Secondary | ICD-10-CM | POA: Diagnosis not present

## 2020-01-06 DIAGNOSIS — N840 Polyp of corpus uteri: Secondary | ICD-10-CM | POA: Diagnosis not present

## 2020-02-06 ENCOUNTER — Other Ambulatory Visit (HOSPITAL_COMMUNITY)
Admission: RE | Admit: 2020-02-06 | Discharge: 2020-02-06 | Disposition: A | Discharge status: Home / Self Care | Payer: Medicare PPO | Source: Ambulatory Visit | Attending: Gastroenterology | Admitting: Gastroenterology

## 2020-02-06 DIAGNOSIS — Z20822 Contact with and (suspected) exposure to covid-19: Secondary | ICD-10-CM | POA: Insufficient documentation

## 2020-02-06 DIAGNOSIS — Z01812 Encounter for preprocedural laboratory examination: Secondary | ICD-10-CM | POA: Insufficient documentation

## 2020-02-06 LAB — SARS CORONAVIRUS 2 (TAT 6-24 HRS): SARS Coronavirus 2: NEGATIVE

## 2020-02-07 ENCOUNTER — Encounter (HOSPITAL_COMMUNITY): Payer: Self-pay | Admitting: Gastroenterology

## 2020-02-07 NOTE — Progress Notes (Signed)
Patient denies shortness of breath, fever, cough or chest pain.  PCP - Dr Dierdre Forth Cardiologist - n/a  Chest x-ray - n/a EKG - n/a Stress Test - n/a ECHO - n/a Cardiac Cath - n/a  STOP now taking any Aspirin (unless otherwise instructed by your surgeon), Aleve, Naproxen, Ibuprofen, Motrin, Advil, Goody's, BC's, all herbal medications, fish oil, and all vitamins.   Coronavirus Screening Covid test on 02/06/20 was negative.  Patient verbalized understanding of instructions that were given via phone.

## 2020-02-09 NOTE — Anesthesia Preprocedure Evaluation (Addendum)
Anesthesia Evaluation  Patient identified by MRN, date of birth, ID band Patient awake    Reviewed: Allergy & Precautions, NPO status , Patient's Chart, lab work & pertinent test results  Airway Mallampati: II  TM Distance: >3 FB Neck ROM: Full    Dental no notable dental hx. (+) Teeth Intact, Dental Advisory Given   Pulmonary neg pulmonary ROS,    Pulmonary exam normal breath sounds clear to auscultation       Cardiovascular negative cardio ROS Normal cardiovascular exam Rhythm:Regular Rate:Normal     Neuro/Psych negative neurological ROS  negative psych ROS   GI/Hepatic Neg liver ROS,   Endo/Other  negative endocrine ROS  Renal/GU negative Renal ROS     Musculoskeletal  (+) Arthritis ,   Abdominal   Peds  Hematology negative hematology ROS (+) anemia ,   Anesthesia Other Findings   Reproductive/Obstetrics                            Anesthesia Physical Anesthesia Plan  ASA: II  Anesthesia Plan: MAC   Post-op Pain Management:    Induction: Intravenous  PONV Risk Score and Plan: 2 and Treatment may vary due to age or medical condition  Airway Management Planned: Nasal Cannula and Natural Airway  Additional Equipment: None  Intra-op Plan:   Post-operative Plan:   Informed Consent: I have reviewed the patients History and Physical, chart, labs and discussed the procedure including the risks, benefits and alternatives for the proposed anesthesia with the patient or authorized representative who has indicated his/her understanding and acceptance.     Dental advisory given  Plan Discussed with:   Anesthesia Plan Comments: (Hx of colon polyps for colonoscopy)       Anesthesia Quick Evaluation

## 2020-02-10 ENCOUNTER — Encounter (HOSPITAL_COMMUNITY)
Admission: RE | Disposition: A | Discharge status: Home / Self Care | Payer: Self-pay | Source: Home / Self Care | Attending: Gastroenterology

## 2020-02-10 ENCOUNTER — Encounter (HOSPITAL_COMMUNITY): Payer: Self-pay | Admitting: Gastroenterology

## 2020-02-10 ENCOUNTER — Other Ambulatory Visit: Payer: Self-pay

## 2020-02-10 ENCOUNTER — Ambulatory Visit (HOSPITAL_COMMUNITY)
Admission: RE | Admit: 2020-02-10 | Discharge: 2020-02-10 | Disposition: A | Discharge status: Home / Self Care | Payer: Medicare PPO | Attending: Gastroenterology | Admitting: Gastroenterology

## 2020-02-10 ENCOUNTER — Ambulatory Visit (HOSPITAL_COMMUNITY): Payer: Medicare PPO | Admitting: Anesthesiology

## 2020-02-10 DIAGNOSIS — Z8371 Family history of colonic polyps: Secondary | ICD-10-CM | POA: Diagnosis not present

## 2020-02-10 DIAGNOSIS — K635 Polyp of colon: Secondary | ICD-10-CM

## 2020-02-10 DIAGNOSIS — Z86010 Personal history of colonic polyps: Secondary | ICD-10-CM | POA: Insufficient documentation

## 2020-02-10 DIAGNOSIS — D122 Benign neoplasm of ascending colon: Secondary | ICD-10-CM | POA: Diagnosis not present

## 2020-02-10 DIAGNOSIS — M1990 Unspecified osteoarthritis, unspecified site: Secondary | ICD-10-CM | POA: Insufficient documentation

## 2020-02-10 DIAGNOSIS — Q438 Other specified congenital malformations of intestine: Secondary | ICD-10-CM | POA: Insufficient documentation

## 2020-02-10 DIAGNOSIS — D120 Benign neoplasm of cecum: Secondary | ICD-10-CM | POA: Insufficient documentation

## 2020-02-10 DIAGNOSIS — K641 Second degree hemorrhoids: Secondary | ICD-10-CM | POA: Diagnosis not present

## 2020-02-10 DIAGNOSIS — E785 Hyperlipidemia, unspecified: Secondary | ICD-10-CM | POA: Diagnosis not present

## 2020-02-10 HISTORY — PX: SUBMUCOSAL LIFTING INJECTION: SHX6855

## 2020-02-10 HISTORY — PX: HEMOSTASIS CLIP PLACEMENT: SHX6857

## 2020-02-10 HISTORY — DX: Unspecified osteoarthritis, unspecified site: M19.90

## 2020-02-10 HISTORY — PX: COLONOSCOPY WITH PROPOFOL: SHX5780

## 2020-02-10 HISTORY — PX: ENDOSCOPIC MUCOSAL RESECTION: SHX6839

## 2020-02-10 SURGERY — COLONOSCOPY WITH PROPOFOL
Anesthesia: Monitor Anesthesia Care

## 2020-02-10 MED ORDER — LACTATED RINGERS IV SOLN: INTRAVENOUS | Status: DC

## 2020-02-10 MED ORDER — PHENYLEPHRINE HCL-NACL 10-0.9 MG/250ML-% IV SOLN
INTRAVENOUS | Status: DC | PRN
  Administered 2020-02-10: 50 ug/min via INTRAVENOUS

## 2020-02-10 MED ORDER — PROPOFOL 500 MG/50ML IV EMUL
INTRAVENOUS | Status: DC | PRN
  Administered 2020-02-10: 100 ug/kg/min via INTRAVENOUS

## 2020-02-10 MED ORDER — SODIUM CHLORIDE 0.9 % IV SOLN: INTRAVENOUS | Status: DC

## 2020-02-10 MED ORDER — GLYCOPYRROLATE 0.2 MG/ML IJ SOLN
INTRAMUSCULAR | Status: DC | PRN
  Administered 2020-02-10: .1 mg via INTRAVENOUS

## 2020-02-10 MED ORDER — EPHEDRINE SULFATE-NACL 50-0.9 MG/10ML-% IV SOSY
PREFILLED_SYRINGE | INTRAVENOUS | Status: DC | PRN
  Administered 2020-02-10 (×2): 10 mg via INTRAVENOUS

## 2020-02-10 MED ORDER — PROPOFOL 10 MG/ML IV BOLUS
INTRAVENOUS | Status: DC | PRN
  Administered 2020-02-10: 40 mg via INTRAVENOUS

## 2020-02-10 SURGICAL SUPPLY — 21 items

## 2020-02-10 NOTE — Anesthesia Postprocedure Evaluation (Signed)
Anesthesia Post Note  Patient: KYNADEE DAM  Procedure(s) Performed: COLONOSCOPY WITH PROPOFOL (N/A ) ENDOSCOPIC MUCOSAL RESECTION (N/A ) SUBMUCOSAL LIFTING INJECTION HEMOSTASIS CLIP PLACEMENT     Patient location during evaluation: Endoscopy Anesthesia Type: MAC Level of consciousness: awake and alert Pain management: pain level controlled Vital Signs Assessment: post-procedure vital signs reviewed and stable Respiratory status: spontaneous breathing, nonlabored ventilation, respiratory function stable and patient connected to nasal cannula oxygen Cardiovascular status: blood pressure returned to baseline and stable Postop Assessment: no apparent nausea or vomiting Anesthetic complications: no   No complications documented.  Last Vitals:  Vitals:   02/10/20 1315 02/10/20 1330  BP: 108/61 111/64  Pulse: 67 63  Resp: 19 15  Temp:  36.5 C  SpO2: 100% 98%    Last Pain:  Vitals:   02/10/20 1330  TempSrc:   PainSc: 0-No pain                 Barnet Glasgow

## 2020-02-10 NOTE — Transfer of Care (Signed)
Immediate Anesthesia Transfer of Care Note  Patient: Ashley Gonzalez  Procedure(s) Performed: COLONOSCOPY WITH PROPOFOL (N/A ) ENDOSCOPIC MUCOSAL RESECTION (N/A ) SUBMUCOSAL LIFTING INJECTION HEMOSTASIS CLIP PLACEMENT  Patient Location: PACU  Anesthesia Type:MAC  Level of Consciousness: drowsy and patient cooperative  Airway & Oxygen Therapy: Patient Spontanous Breathing  Post-op Assessment: Report given to RN and Post -op Vital signs reviewed and stable  Post vital signs: Reviewed and stable  Last Vitals:  Vitals Value Taken Time  BP    Temp    Pulse 73 02/10/20 1258  Resp 34 02/10/20 1258  SpO2 100 % 02/10/20 1258  Vitals shown include unvalidated device data.  Last Pain:  Vitals:   02/10/20 1015  TempSrc: Oral  PainSc: 0-No pain         Complications: No complications documented.

## 2020-02-10 NOTE — Op Note (Signed)
[facility] Patient Name: Ashley Gonzalez Procedure Date : 02/10/2020 MRN: [id number] Attending MD: [name] , MD Date of Birth: May 23, 1946 CSN: [id number] Age: 74 Admit Type: Inpatient Procedure:                Colonoscopy Indications:              Excision of colonic polyp Providers:                [name], MD, Carlyn Reichert, RN, Theodora Blow, Technician Referring MD:             Ronald Lobo, MD, Audree Camel. Reade Medicines:                Monitored Anesthesia Care Complications:            No immediate complications. Estimated Blood Loss:     Estimated blood loss was minimal. Procedure:                Pre-Anesthesia Assessment:                           - Prior to the procedure, a History and Physical                            was performed, and patient medications and                            allergies were reviewed. The patient's tolerance of                            previous anesthesia was also reviewed. The risks                            and benefits of the procedure and the sedation                            options and risks were discussed with the patient.                            All questions were answered, and informed consent                            was obtained. Prior Anticoagulants: The patient has                            taken no previous anticoagulant or antiplatelet                            agents. ASA Grade Assessment: III - A patient with                            severe systemic disease. After reviewing the risks  and benefits, the patient was deemed in                            satisfactory condition to undergo the procedure.                           After obtaining informed consent, the colonoscope                            was passed under direct vision. Throughout the                            procedure, the patient's blood pressure, pulse, and                             oxygen saturations were monitored continuously. The                            PCF-H190DL ([id number] Olympus pediatric colonoscope                            was introduced through the anus and advanced to the                            5 cm into the ileum. The colonoscopy was                            technically difficult and complex. Successful                            completion of the procedure was aided by performing                            the maneuvers documented (below) in this report.                            The patient tolerated the procedure. The quality of                            the bowel preparation was adequate. The terminal                            ileum, ileocecal valve, appendiceal orifice, and                            rectum were photographed. Scope In: 11:01:43 AM Scope Out: 70:35:00 PM Scope Withdrawal Time: 1 hour 38 minutes 8 seconds  Total Procedure Duration: 1 hour 45 minutes 59 seconds  Findings:      The digital rectal exam findings include hemorrhoids. Pertinent       negatives include no palpable rectal lesions.      The right colon was moderately tortuous.      The terminal ileum and ileocecal valve appeared normal.      A 45 mm polyp was found in  the cecum base. The appendiceal orifice was       found and the lesion did not involve this area. The polyp was granular       lateral spreading and encompassed at least 50% of the cecal base.       Preparations were made for mucosal resection. Orise gel was injected to       raise the lesion. Piecemeal mucosal resection using a snare was       performed. Resection and retrieval were complete. The defect of the       resection was felt to be approximately 60% of the cecal base. It could       not be closed in its entirety due to the size. To try to prevent       bleeding after mucosal resection, four hemostatic clips were       successfully placed (MR conditional) on areas that  were felt to be       higher risk. There was no bleeding at the end of the procedure.      A 20 mm polyp was found in the proximal ascending colon (2nd haustral       fold from the IC Valve). The polyp was sessile. Preparations were made       for mucosal resection. Orise gel was injected to raise the lesion.       Piecemeal mucosal resection using a snare was performed. Resection and       retrieval were complete. To prevent bleeding after mucosal resection,       four hemostatic clips were successfully placed (MR conditional). There       was no bleeding at the end of the procedure.      The rest of the colon mucosa appeared normal - though complete in depth       evaluation not performed today due to intent of original procedure and       recent full colonoscopy.      Non-bleeding non-thrombosed internal hemorrhoids were found during       retroflexion, during perianal exam and during digital exam. The       hemorrhoids were Grade II (internal hemorrhoids that prolapse but reduce       spontaneously). Impression:               - Hemorrhoids found on digital rectal exam.                           - Tortuous colon.                           - The examined portion of the ileum was normal.                           - One 45 mm polyp in the cecum (spanning 50% of the                            cecal base, removed with mucosal resection.                            Resected and retrieved. Clips (MR conditional) were  placed though complete closure not possible due to                            size of defect/resection site.                           - One 20 mm polyp in the proximal ascending colon,                            removed with mucosal resection. Resected and                            retrieved. Clips (MR conditional) were placed.                           - The rest of examined colon is normal appearing -                            though indepth  evaluation not performed due to                            intent of today's procedure.                           - Non-bleeding non-thrombosed internal hemorrhoids. Recommendation:           - The patient will be observed post-procedure,                            until all discharge criteria are met.                           - Discharge patient to home.                           - Patient has a contact number available for                            emergencies. The signs and symptoms of potential                            delayed complications were discussed with the                            patient. Return to normal activities tomorrow.                            Written discharge instructions were provided to the                            patient.                           - Resume previous diet.                           -  Continue present medications.                           - No aspirin, ibuprofen, naproxen, or other                            non-steroidal anti-inflammatory drugs for 2 weeks.                           - Await pathology results.                           - Repeat colonoscopy in 6 months for surveillance                            of both resection sites; Endorotor to be available.                           - Return to GI office.                           - The findings and recommendations were discussed                            with the patient.                           - The findings and recommendations were discussed                            with the patient's family. Procedure Code(s):        --- Professional ---                           [phone], Colonoscopy, flexible; with endoscopic                            mucosal resection Diagnosis Code(s):        --- Professional ---                           K64.1, Second degree hemorrhoids                           K63.5, Polyp of colon                           Q43.8, Other specified congenital  malformations of                            intestine CPT copyright 2019 American Medical Association. All rights reserved. The codes documented in this report are preliminary and upon coder review may  be revised to meet current compliance requirements. [name], MD 02/10/2020 1:02:24 PM Number of Addenda: 0

## 2020-02-10 NOTE — Discharge Instructions (Signed)

## 2020-02-10 NOTE — H&P (Signed)
GASTROENTEROLOGY PROCEDURE H&P NOTE   Primary Care Physician: Maury Dus, MD  HPI: Ashley Gonzalez is a 74 y.o. female who presents for Colonoscopy with EMR attempt of large cecal adenoma.  Past Medical History:  Diagnosis Date  . Anemia   . Arthritis   . Hyperlipidemia   . Tubular adenoma of colon    Past Surgical History:  Procedure Laterality Date  . COLONOSCOPY  08/2019  . TUBAL LIGATION    . WISDOM TOOTH EXTRACTION     No current facility-administered medications for this encounter.   No Known Allergies Family History  Problem Relation Age of Onset  . Diabetes Sister   . Breast cancer Sister        Age 71  . Diabetes Brother   . Colon polyps Brother   . Diabetes Sister   . Diabetes Brother   . Breast cancer Sister   . Colon cancer Neg Hx   . Esophageal cancer Neg Hx   . Stomach cancer Neg Hx   . Pancreatic cancer Neg Hx   . Liver disease Neg Hx   . Inflammatory bowel disease Neg Hx   . Rectal cancer Neg Hx    Social History   Socioeconomic History  . Marital status: Married    Spouse name: Not on file  . Number of children: 1  . Years of education: Not on file  . Highest education level: Not on file  Occupational History  . Not on file  Tobacco Use  . Smoking status: Never Smoker  . Smokeless tobacco: Never Used  Vaping Use  . Vaping Use: Never used  Substance and Sexual Activity  . Alcohol use: No  . Drug use: Never  . Sexual activity: Never    Birth control/protection: Surgical    Comment: Tubal Ligation  Other Topics Concern  . Not on file  Social History Narrative  . Not on file   Social Determinants of Health   Financial Resource Strain:   . Difficulty of Paying Living Expenses:   Food Insecurity:   . Worried About Charity fundraiser in the Last Year:   . Arboriculturist in the Last Year:   Transportation Needs:   . Film/video editor (Medical):   Marland Kitchen Lack of Transportation (Non-Medical):   Physical Activity:   . Days  of Exercise per Week:   . Minutes of Exercise per Session:   Stress:   . Feeling of Stress :   Social Connections:   . Frequency of Communication with Friends and Family:   . Frequency of Social Gatherings with Friends and Family:   . Attends Religious Services:   . Active Member of Clubs or Organizations:   . Attends Archivist Meetings:   Marland Kitchen Marital Status:   Intimate Partner Violence:   . Fear of Current or Ex-Partner:   . Emotionally Abused:   Marland Kitchen Physically Abused:   . Sexually Abused:     Physical Exam: Vital signs in last 24 hours: Pulse Rate:  [64] (P) 64 (07/12 1015) Resp:  [16] (P) 16 (07/12 1015) BP: (P) 142/58 (07/12 1015) SpO2:  [100 %] (P) 100 % (07/12 1015) Weight:  [54.4 kg] (P) 54.4 kg (07/12 1015)   GEN: NAD EYE: Sclerae anicteric ENT: MMM CV: Non-tachycardic GI: Soft, NT/ND NEURO:  Alert & Oriented x 3  Lab Results: No results for input(s): WBC, HGB, HCT, PLT in the last 72 hours. BMET No results for input(s): NA,  K, CL, CO2, GLUCOSE, BUN, CREATININE, CALCIUM in the last 72 hours. LFT No results for input(s): PROT, ALBUMIN, AST, ALT, ALKPHOS, BILITOT, BILIDIR, IBILI in the last 72 hours. PT/INR No results for input(s): LABPROT, INR in the last 72 hours.   Impression / Plan: This is a 74 y.o.female who presents for Colonoscopy with EMR attempt of large cecal adenoma.  The risks and benefits of endoscopic evaluation were discussed with the patient; these include but are not limited to the risk of perforation, infection, bleeding, missed lesions, lack of diagnosis, severe illness requiring hospitalization, as well as anesthesia and sedation related illnesses.  The patient is agreeable to proceed.    [name], MD [facility] Office # [phone]

## 2020-02-12 ENCOUNTER — Encounter (HOSPITAL_COMMUNITY): Payer: Self-pay | Admitting: Gastroenterology

## 2020-02-14 LAB — SURGICAL PATHOLOGY

## 2020-02-18 ENCOUNTER — Encounter: Payer: Self-pay | Admitting: Gastroenterology

## 2020-03-10 DIAGNOSIS — N858 Other specified noninflammatory disorders of uterus: Secondary | ICD-10-CM | POA: Diagnosis not present

## 2020-03-16 ENCOUNTER — Other Ambulatory Visit: Payer: Self-pay | Admitting: Obstetrics and Gynecology

## 2020-03-16 DIAGNOSIS — N840 Polyp of corpus uteri: Secondary | ICD-10-CM | POA: Diagnosis not present

## 2020-03-16 DIAGNOSIS — N950 Postmenopausal bleeding: Secondary | ICD-10-CM | POA: Diagnosis not present

## 2020-04-07 ENCOUNTER — Other Ambulatory Visit: Payer: Self-pay | Admitting: Family Medicine

## 2020-04-07 DIAGNOSIS — Z1231 Encounter for screening mammogram for malignant neoplasm of breast: Secondary | ICD-10-CM

## 2020-05-11 ENCOUNTER — Other Ambulatory Visit: Payer: Self-pay

## 2020-05-11 ENCOUNTER — Ambulatory Visit
Admission: RE | Admit: 2020-05-11 | Discharge: 2020-05-11 | Disposition: A | Discharge status: Home / Self Care | Payer: Medicare PPO | Source: Ambulatory Visit | Attending: Family Medicine | Admitting: Family Medicine

## 2020-05-11 DIAGNOSIS — Z1231 Encounter for screening mammogram for malignant neoplasm of breast: Secondary | ICD-10-CM

## 2020-05-21 ENCOUNTER — Ambulatory Visit: Payer: Medicare PPO | Attending: Internal Medicine

## 2020-05-21 DIAGNOSIS — Z23 Encounter for immunization: Secondary | ICD-10-CM

## 2020-05-21 NOTE — Progress Notes (Signed)
   Covid-19 Vaccination Clinic  Name:  YAZMINA PAREJA    MRN: [id number] DOB: 08/04/1945  05/21/2020  Ms. Hunger was observed post Covid-19 immunization for 15 minutes without incident. She was provided with Vaccine Information Sheet and instruction to access the V-Safe system.   Ms. Nehring was instructed to call 911 with any severe reactions post vaccine: Marland Kitchen Difficulty breathing  . Swelling of face and throat  . A fast heartbeat  . A bad rash all over body  . Dizziness and weakness

## 2020-05-27 DIAGNOSIS — M25561 Pain in right knee: Secondary | ICD-10-CM | POA: Diagnosis not present

## 2020-05-27 DIAGNOSIS — M810 Age-related osteoporosis without current pathological fracture: Secondary | ICD-10-CM | POA: Diagnosis not present

## 2020-05-27 DIAGNOSIS — E7800 Pure hypercholesterolemia, unspecified: Secondary | ICD-10-CM | POA: Diagnosis not present

## 2020-05-27 DIAGNOSIS — R030 Elevated blood-pressure reading, without diagnosis of hypertension: Secondary | ICD-10-CM | POA: Diagnosis not present

## 2020-10-05 ENCOUNTER — Other Ambulatory Visit: Payer: Self-pay

## 2020-10-05 ENCOUNTER — Telehealth: Payer: Self-pay | Admitting: Gastroenterology

## 2020-10-05 DIAGNOSIS — K635 Polyp of colon: Secondary | ICD-10-CM

## 2020-10-05 MED ORDER — PEG 3350-KCL-NA BICARB-NACL 420 G PO SOLR: 4000.0000 mL | Freq: Once | ORAL | 0 refills | Status: AC

## 2020-10-05 NOTE — Telephone Encounter (Signed)
Colon EMR scheduled, pt instructed and medications reviewed.  Patient instructions mailed to home.  Patient to call with any questions or concerns.  

## 2020-10-05 NOTE — Telephone Encounter (Signed)
The pt has been scheduled for colon EMR on 11/30/20 at 9 am with Dr Rush Landmark at [facility]. COVID on 11/26/20 at 1035 am.

## 2020-10-05 NOTE — Telephone Encounter (Signed)
Pt is requesting a call back from a nurse to discuss if her colonoscopy needs to be done at the hospital or [place]

## 2020-10-08 DIAGNOSIS — K1379 Other lesions of oral mucosa: Secondary | ICD-10-CM | POA: Diagnosis not present

## 2020-11-09 ENCOUNTER — Telehealth: Payer: Self-pay | Admitting: Gastroenterology

## 2020-11-09 NOTE — Telephone Encounter (Signed)
Pt was scheduled by nurse.

## 2020-11-09 NOTE — Telephone Encounter (Signed)
I spoke with the pt and she has decided to keep the prep that was given due to cost.

## 2020-11-22 IMAGING — MG MM DIGITAL DIAGNOSTIC UNILAT*L* W/ TOMO W/ CAD
6 of 12 series · 6 of 36 positions shown · non-contrast
Comparison: Previous exam(s).

CLINICAL DATA: Patient was called back from screening mammogram for
a possible mass in the left breast.

EXAM:
DIGITAL DIAGNOSTIC LEFT MAMMOGRAM WITH CAD AND TOMO
ULTRASOUND LEFT BREAST

[L MLO synth-2D (1 of 5)]
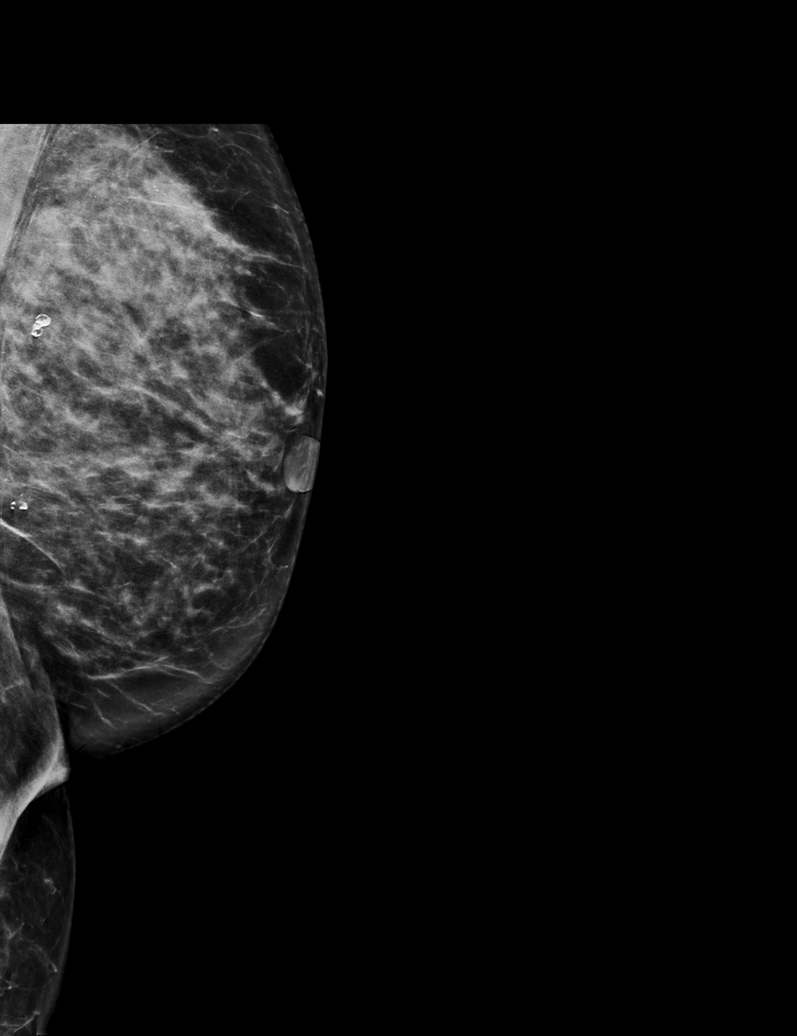

[L MLO synth-2D (2 of 5)]
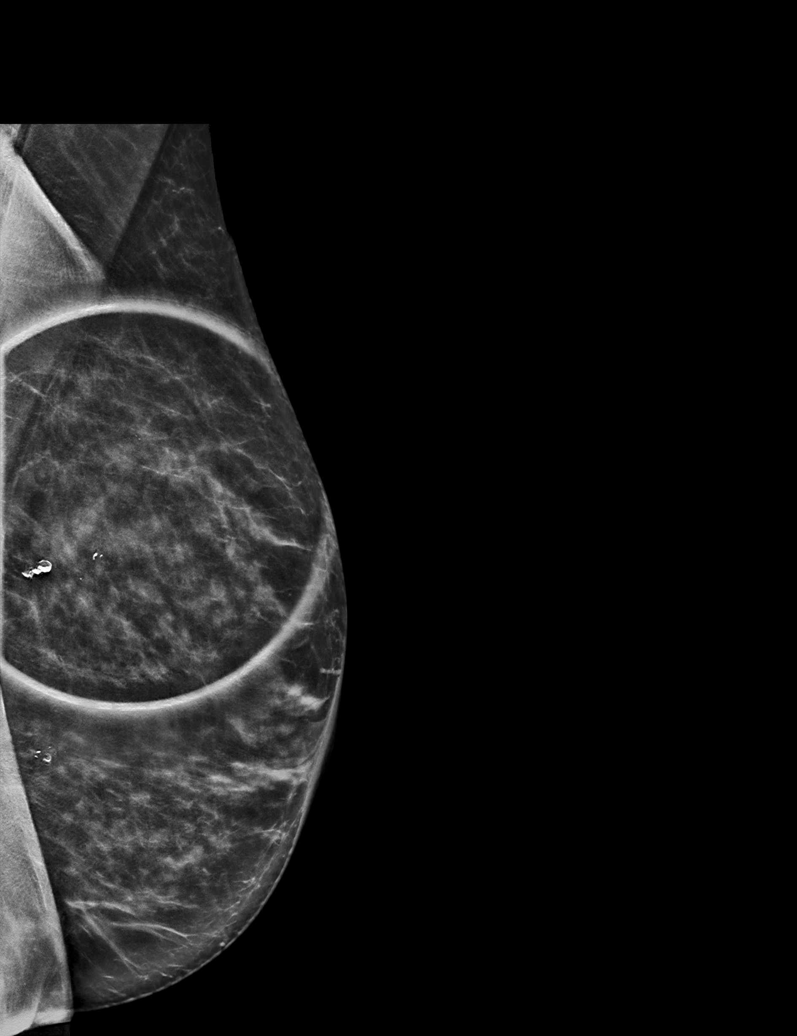

[L ML synth-2D]
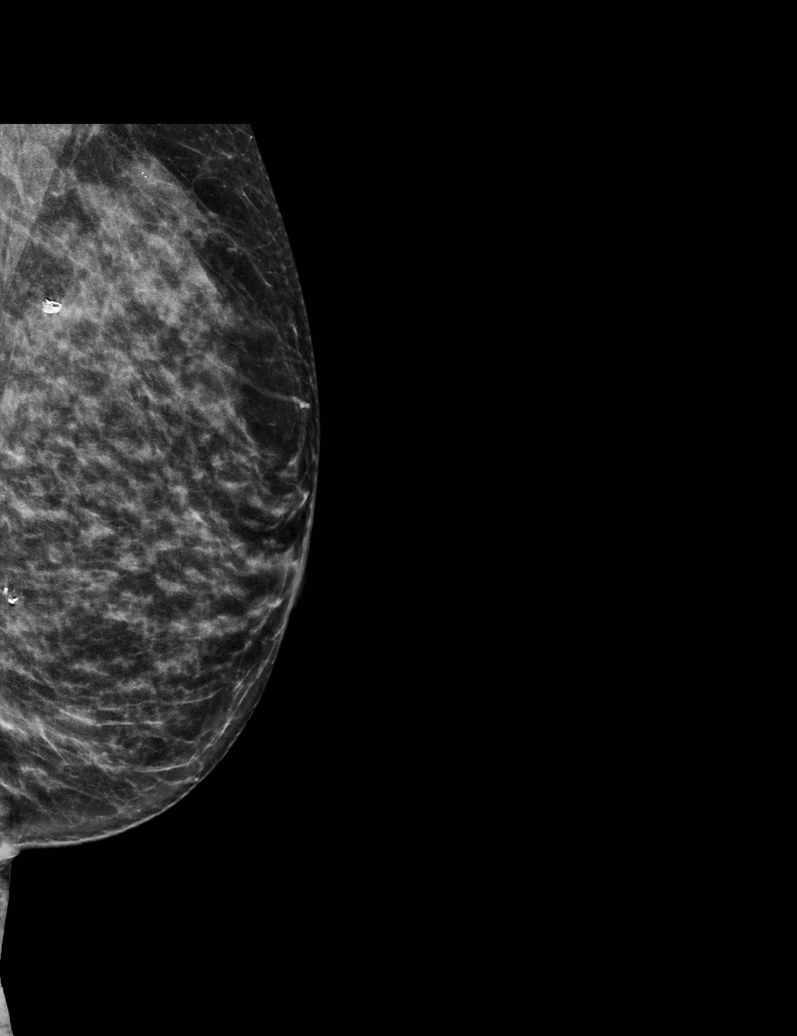

[L MLO synth-2D (3 of 5)]
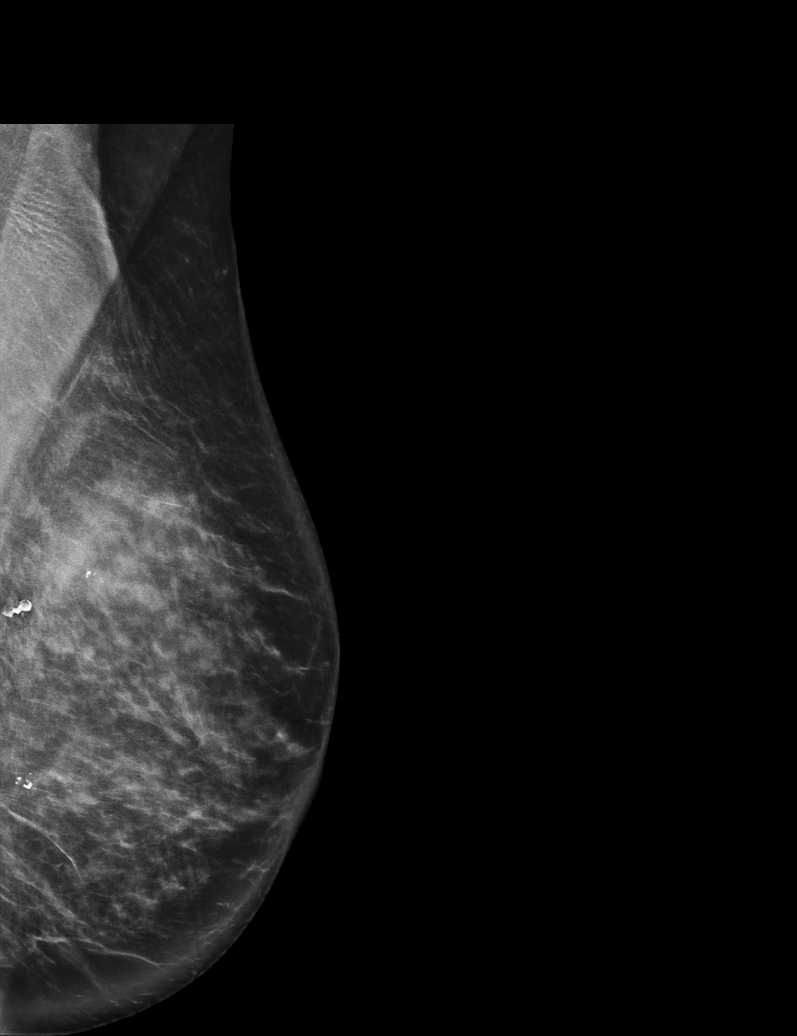

[L MLO synth-2D (4 of 5)]
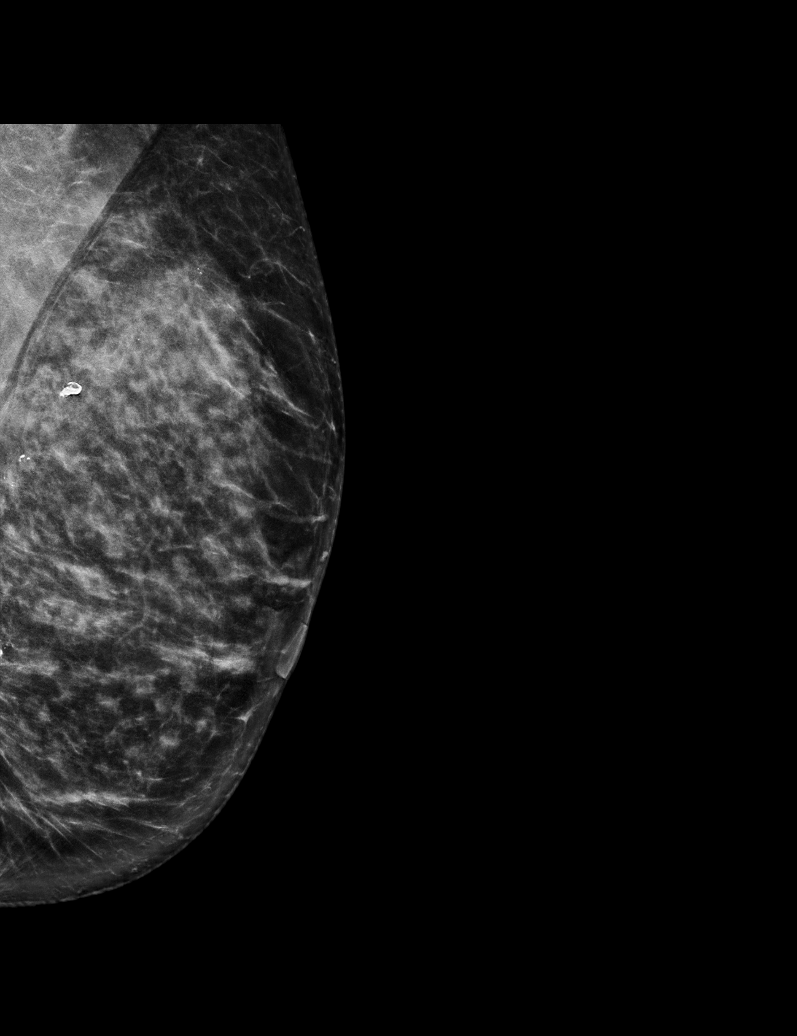

[L MLO synth-2D (5 of 5)]
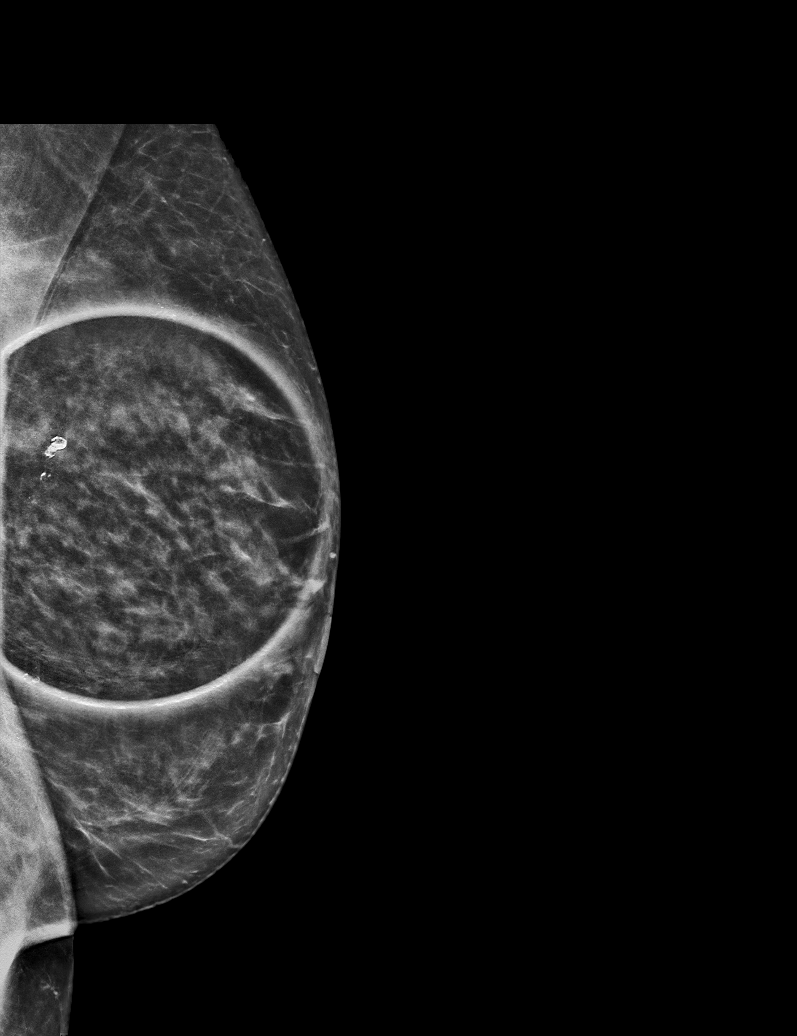

[6 of 36 positions shown; findings below may reference images not displayed]

ACR Breast Density Category c: The breast tissue is heterogeneously
dense, which may obscure small masses.
FINDINGS: Additional imaging of the left breast was performed. There is a 1 cm
mass in the far posterior aspect of the upper-outer quadrant of the
breast. There are no malignant type microcalcifications.

Mammographic images were processed with CAD.

Targeted ultrasound is performed, showing a well-circumscribed
anechoic cyst in left breast at [DATE] 5 cm from the nipple measuring
1.3 x 0.8 x 1.1 cm. Sonographic evaluation the left axilla does not
show any enlarged adenopathy.
IMPRESSION: Left breast cyst.  No evidence of malignancy in the left breast.

RECOMMENDATION:
Bilateral screening mammogram in 1 year is recommended.

I have discussed the findings and recommendations with the patient.
If applicable, a reminder letter will be sent to the patient
regarding the next appointment.

BI-RADS CATEGORY  2: Benign.

## 2020-11-26 ENCOUNTER — Other Ambulatory Visit: Payer: Self-pay

## 2020-11-26 ENCOUNTER — Other Ambulatory Visit (HOSPITAL_COMMUNITY)
Admission: RE | Admit: 2020-11-26 | Discharge: 2020-11-26 | Disposition: A | Discharge status: Home / Self Care | Payer: Medicare Other | Source: Ambulatory Visit | Attending: Gastroenterology | Admitting: Gastroenterology

## 2020-11-26 DIAGNOSIS — Z01812 Encounter for preprocedural laboratory examination: Secondary | ICD-10-CM | POA: Diagnosis not present

## 2020-11-26 DIAGNOSIS — Z20822 Contact with and (suspected) exposure to covid-19: Secondary | ICD-10-CM | POA: Diagnosis not present

## 2020-11-26 LAB — SARS CORONAVIRUS 2 (TAT 6-24 HRS): SARS Coronavirus 2: NEGATIVE

## 2020-11-29 NOTE — Anesthesia Preprocedure Evaluation (Addendum)
Anesthesia Evaluation  Patient identified by MRN, date of birth, ID band Patient awake    Reviewed: Allergy & Precautions, NPO status , Patient's Chart, lab work & pertinent test results  History of Anesthesia Complications Negative for: history of anesthetic complications  Airway Mallampati: II  TM Distance: >3 FB Neck ROM: Full    Dental  (+) Partial Lower, Partial Upper, Dental Advisory Given   Pulmonary neg pulmonary ROS,    Pulmonary exam normal        Cardiovascular negative cardio ROS Normal cardiovascular exam     Neuro/Psych negative neurological ROS  negative psych ROS   GI/Hepatic Neg liver ROS,   Endo/Other  negative endocrine ROS  Renal/GU negative Renal ROS     Musculoskeletal  (+) Arthritis ,   Abdominal   Peds  Hematology negative hematology ROS (+) anemia ,   Anesthesia Other Findings   Reproductive/Obstetrics                            Anesthesia Physical  Anesthesia Plan  ASA: II  Anesthesia Plan: MAC   Post-op Pain Management:    Induction:   PONV Risk Score and Plan: 2 and Treatment may vary due to age or medical condition, Ondansetron and Propofol infusion  Airway Management Planned: Nasal Cannula and Natural Airway  Additional Equipment: None  Intra-op Plan:   Post-operative Plan:   Informed Consent: I have reviewed the patients History and Physical, chart, labs and discussed the procedure including the risks, benefits and alternatives for the proposed anesthesia with the patient or authorized representative who has indicated his/her understanding and acceptance.     Dental advisory given  Plan Discussed with: Anesthesiologist and CRNA  Anesthesia Plan Comments: (Hx of colon polyps for colonoscopy)       Anesthesia Quick Evaluation

## 2020-11-30 ENCOUNTER — Other Ambulatory Visit: Payer: Self-pay

## 2020-11-30 ENCOUNTER — Ambulatory Visit (HOSPITAL_COMMUNITY): Payer: Medicare Other | Admitting: Anesthesiology

## 2020-11-30 ENCOUNTER — Encounter (HOSPITAL_COMMUNITY): Payer: Self-pay | Admitting: Gastroenterology

## 2020-11-30 ENCOUNTER — Encounter (HOSPITAL_COMMUNITY)
Admission: RE | Disposition: A | Discharge status: Home / Self Care | Payer: Self-pay | Source: Home / Self Care | Attending: Gastroenterology

## 2020-11-30 ENCOUNTER — Ambulatory Visit (HOSPITAL_COMMUNITY)
Admission: RE | Admit: 2020-11-30 | Discharge: 2020-11-30 | Disposition: A | Discharge status: Home / Self Care | Payer: Medicare Other | Attending: Gastroenterology | Admitting: Gastroenterology

## 2020-11-30 DIAGNOSIS — D122 Benign neoplasm of ascending colon: Secondary | ICD-10-CM

## 2020-11-30 DIAGNOSIS — Z86010 Personal history of colonic polyps: Secondary | ICD-10-CM

## 2020-11-30 DIAGNOSIS — E785 Hyperlipidemia, unspecified: Secondary | ICD-10-CM | POA: Diagnosis not present

## 2020-11-30 DIAGNOSIS — D120 Benign neoplasm of cecum: Secondary | ICD-10-CM | POA: Insufficient documentation

## 2020-11-30 DIAGNOSIS — K641 Second degree hemorrhoids: Secondary | ICD-10-CM | POA: Diagnosis not present

## 2020-11-30 DIAGNOSIS — K635 Polyp of colon: Secondary | ICD-10-CM | POA: Diagnosis not present

## 2020-11-30 DIAGNOSIS — Z8371 Family history of colonic polyps: Secondary | ICD-10-CM | POA: Diagnosis not present

## 2020-11-30 DIAGNOSIS — Z09 Encounter for follow-up examination after completed treatment for conditions other than malignant neoplasm: Secondary | ICD-10-CM | POA: Diagnosis not present

## 2020-11-30 HISTORY — PX: POLYPECTOMY: SHX5525

## 2020-11-30 HISTORY — PX: ENDOSCOPIC MUCOSAL RESECTION: SHX6839

## 2020-11-30 HISTORY — PX: HEMOSTASIS CLIP PLACEMENT: SHX6857

## 2020-11-30 HISTORY — PX: SUBMUCOSAL LIFTING INJECTION: SHX6855

## 2020-11-30 HISTORY — PX: COLONOSCOPY WITH PROPOFOL: SHX5780

## 2020-11-30 SURGERY — COLONOSCOPY WITH PROPOFOL
Anesthesia: Monitor Anesthesia Care

## 2020-11-30 MED ORDER — SODIUM CHLORIDE 0.9 % IV SOLN: INTRAVENOUS | Status: DC

## 2020-11-30 MED ORDER — PROPOFOL 500 MG/50ML IV EMUL
INTRAVENOUS | Status: AC
  Filled 2020-11-30: qty 50

## 2020-11-30 MED ORDER — EPHEDRINE SULFATE-NACL 50-0.9 MG/10ML-% IV SOSY
PREFILLED_SYRINGE | INTRAVENOUS | Status: DC | PRN
  Administered 2020-11-30 (×2): 10 mg via INTRAVENOUS

## 2020-11-30 MED ORDER — PROPOFOL 10 MG/ML IV BOLUS
INTRAVENOUS | Status: DC | PRN
  Administered 2020-11-30 (×2): 20 mg via INTRAVENOUS

## 2020-11-30 MED ORDER — ONDANSETRON HCL 4 MG/2ML IJ SOLN
INTRAMUSCULAR | Status: DC | PRN
  Administered 2020-11-30: 4 mg via INTRAVENOUS

## 2020-11-30 MED ORDER — LIDOCAINE 2% (20 MG/ML) 5 ML SYRINGE
INTRAMUSCULAR | Status: DC | PRN
  Administered 2020-11-30: 60 mg via INTRAVENOUS

## 2020-11-30 MED ORDER — LACTATED RINGERS IV SOLN: INTRAVENOUS | Status: DC

## 2020-11-30 MED ORDER — LACTATED RINGERS IV SOLN: Freq: Once | INTRAVENOUS | Status: AC

## 2020-11-30 MED ORDER — PROPOFOL 500 MG/50ML IV EMUL
INTRAVENOUS | Status: DC | PRN
  Administered 2020-11-30: 125 ug/kg/min via INTRAVENOUS

## 2020-11-30 SURGICAL SUPPLY — 21 items

## 2020-11-30 NOTE — Transfer of Care (Signed)
Immediate Anesthesia Transfer of Care Note  Patient: MAKENDRA VIGEANT  Procedure(s) Performed: COLONOSCOPY WITH PROPOFOL (N/A ) ENDOSCOPIC MUCOSAL RESECTION (N/A ) SUBMUCOSAL LIFTING INJECTION HEMOSTASIS CLIP PLACEMENT POLYPECTOMY  Patient Location: PACU  Anesthesia Type:MAC  Level of Consciousness: awake, alert  and oriented  Airway & Oxygen Therapy: Patient Spontanous Breathing and Patient connected to face mask oxygen  Post-op Assessment: Report given to RN and Post -op Vital signs reviewed and stable  Post vital signs: Reviewed and stable  Last Vitals:  Vitals Value Taken Time  BP    Temp    Pulse    Resp 19 11/30/20 1126  SpO2    Vitals shown include unvalidated device data.  Last Pain:  Vitals:   11/30/20 0840  TempSrc: Oral  PainSc: 0-No pain         Complications: No complications documented.

## 2020-11-30 NOTE — Anesthesia Postprocedure Evaluation (Deleted)
Anesthesia Post Note  Patient: Ashley Gonzalez  Procedure(s) Performed: COLONOSCOPY WITH PROPOFOL (N/A ) ENDOSCOPIC MUCOSAL RESECTION (N/A ) SUBMUCOSAL LIFTING INJECTION HEMOSTASIS CLIP PLACEMENT POLYPECTOMY     Patient location during evaluation: Endoscopy Anesthesia Type: MAC Level of consciousness: awake and alert Pain management: pain level controlled Vital Signs Assessment: post-procedure vital signs reviewed and stable Respiratory status: spontaneous breathing and respiratory function stable Cardiovascular status: stable Postop Assessment: no apparent nausea or vomiting Anesthetic complications: no   No complications documented.  Last Vitals:  Vitals:   11/30/20 0840  BP: (!) 167/66  Pulse: (!) 53  Resp: 14  Temp: 36.4 C  SpO2: 99%    Last Pain:  Vitals:   11/30/20 0840  TempSrc: Oral  PainSc: 0-No pain                 [name] DANIEL

## 2020-11-30 NOTE — Discharge Instructions (Signed)
YOU HAD AN ENDOSCOPIC PROCEDURE TODAY: Refer to the procedure report and other information in the discharge instructions given to you for any specific questions about what was found during the examination. If this information does not answer your questions, please call [facility] office at [phone] to clarify.  ° °YOU SHOULD EXPECT: Some feelings of bloating in the abdomen. Passage of more gas than usual. Walking can help get rid of the air that was put into your GI tract during the procedure and reduce the bloating. If you had a lower endoscopy (such as a colonoscopy or flexible sigmoidoscopy) you may notice spotting of blood in your stool or on the toilet paper. Some abdominal soreness may be present for a day or two, also. ° °DIET: Your first meal following the procedure should be a light meal and then it is ok to progress to your normal diet. A half-sandwich or bowl of soup is an example of a good first meal. Heavy or fried foods are harder to digest and may make you feel nauseous or bloated. Drink plenty of fluids but you should avoid alcoholic beverages for 24 hours. If you had a esophageal dilation, please see attached instructions for diet.   ° °ACTIVITY: Your care partner should take you home directly after the procedure. You should plan to take it easy, moving slowly for the rest of the day. You can resume normal activity the day after the procedure however YOU SHOULD NOT DRIVE, use power tools, machinery or perform tasks that involve climbing or major physical exertion for 24 hours (because of the sedation medicines used during the test).  ° °SYMPTOMS TO REPORT IMMEDIATELY: °A gastroenterologist can be reached at any hour. Please call [phone]  for any of the following symptoms:  °Following lower endoscopy (colonoscopy, flexible sigmoidoscopy) °Excessive amounts of blood in the stool  °Significant tenderness, worsening of abdominal pains  °Swelling of the abdomen that is new, acute  °Fever of 100° or  higher  °Following upper endoscopy (EGD, EUS, ERCP, esophageal dilation) °Vomiting of blood or coffee ground material  °New, significant abdominal pain  °New, significant chest pain or pain under the shoulder blades  °Painful or persistently difficult swallowing  °New shortness of breath  °Black, tarry-looking or red, bloody stools ° °FOLLOW UP:  °If any biopsies were taken you will be contacted by phone or by letter within the next 1-3 weeks. Call [phone]  if you have not heard about the biopsies in 3 weeks.  °Please also call with any specific questions about appointments or follow up tests. ° °

## 2020-11-30 NOTE — H&P (Signed)
GASTROENTEROLOGY PROCEDURE H&P NOTE   Primary Care Physician: Maury Dus, MD  HPI: Ashley Gonzalez is a 75 y.o. female who presents for Colonoscopy for follow up of Cecal TA s/p piecemeal EMR in 7/21.  Past Medical History:  Diagnosis Date  . Anemia   . Arthritis   . Hyperlipidemia   . Tubular adenoma of colon    Past Surgical History:  Procedure Laterality Date  . COLONOSCOPY  08/2019  . COLONOSCOPY WITH PROPOFOL N/A 02/10/2020   Procedure: COLONOSCOPY WITH PROPOFOL;  Surgeon: Rush Landmark Telford Nab., MD;  Location: [facility];  Service: Gastroenterology;  Laterality: N/A;  . ENDOSCOPIC MUCOSAL RESECTION N/A 02/10/2020   Procedure: ENDOSCOPIC MUCOSAL RESECTION;  Surgeon: Rush Landmark Telford Nab., MD;  Location: [facility];  Service: Gastroenterology;  Laterality: N/A;  . HEMOSTASIS CLIP PLACEMENT  02/10/2020   Procedure: HEMOSTASIS CLIP PLACEMENT;  Surgeon: Irving Copas., MD;  Location: [facility];  Service: Gastroenterology;;  . Lia Foyer LIFTING INJECTION  02/10/2020   Procedure: SUBMUCOSAL LIFTING INJECTION;  Surgeon: Irving Copas., MD;  Location: [employer];  Service: Gastroenterology;;  . TUBAL LIGATION    . WISDOM TOOTH EXTRACTION     Current Facility-Administered Medications  Medication Dose Route Frequency Provider Last Rate Last Admin  . 0.9 %  sodium chloride infusion   Intravenous Continuous Mansouraty, Telford Nab., MD      . lactated ringers infusion   Intravenous Continuous Mansouraty, Telford Nab., MD       No Known Allergies Family History  Problem Relation Age of Onset  . Diabetes Sister   . Breast cancer Sister        Age 38  . Diabetes Brother   . Colon polyps Brother   . Diabetes Sister   . Diabetes Brother   . Breast cancer Sister   . Colon cancer Neg Hx   . Esophageal cancer Neg Hx   . Stomach cancer Neg Hx   . Pancreatic cancer Neg Hx   . Liver disease Neg Hx   . Inflammatory bowel disease Neg Hx   . Rectal cancer  Neg Hx    Social History   Socioeconomic History  . Marital status: Married    Spouse name: Not on file  . Number of children: 1  . Years of education: Not on file  . Highest education level: Not on file  Occupational History  . Not on file  Tobacco Use  . Smoking status: Never Smoker  . Smokeless tobacco: Never Used  Vaping Use  . Vaping Use: Never used  Substance and Sexual Activity  . Alcohol use: No  . Drug use: Never  . Sexual activity: Never    Birth control/protection: Surgical    Comment: Tubal Ligation  Other Topics Concern  . Not on file  Social History Narrative  . Not on file   Social Determinants of Health   Financial Resource Strain: Not on file  Food Insecurity: Not on file  Transportation Needs: Not on file  Physical Activity: Not on file  Stress: Not on file  Social Connections: Not on file  Intimate Partner Violence: Not on file    Physical Exam: Vital signs in last 24 hours: Temp:  [97.6 F (36.4 C)] 97.6 F (36.4 C) (05/02 0840) Pulse Rate:  [53] 53 (05/02 0840) Resp:  [14] 14 (05/02 0840) BP: (167)/(66) 167/66 (05/02 0840) SpO2:  [99 %] 99 % (05/02 0840) Weight:  [52.2 kg] 52.2 kg (05/02 0840)   GEN: NAD EYE:  Sclerae anicteric ENT: MMM CV: Non-tachycardic GI: Soft, NT/ND NEURO:  Alert & Oriented x 3  Lab Results: No results for input(s): WBC, HGB, HCT, PLT in the last 72 hours. BMET No results for input(s): NA, K, CL, CO2, GLUCOSE, BUN, CREATININE, CALCIUM in the last 72 hours. LFT No results for input(s): PROT, ALBUMIN, AST, ALT, ALKPHOS, BILITOT, BILIDIR, IBILI in the last 72 hours. PT/INR No results for input(s): LABPROT, INR in the last 72 hours.   Impression / Plan: This is a 75 y.o.female who presents for Colonoscopy for follow up of Cecal TA s/p piecemeal EMR in 7/21.  The risks and benefits of endoscopic evaluation were discussed with the patient; these include but are not limited to the risk of perforation, infection,  bleeding, missed lesions, lack of diagnosis, severe illness requiring hospitalization, as well as anesthesia and sedation related illnesses.  The patient is agreeable to proceed.    [name], MD [facility]oscopy Office # [phone]

## 2020-11-30 NOTE — Anesthesia Postprocedure Evaluation (Signed)
Anesthesia Post Note  Patient: Ashley Gonzalez  Procedure(s) Performed: COLONOSCOPY WITH PROPOFOL (N/A ) ENDOSCOPIC MUCOSAL RESECTION (N/A ) SUBMUCOSAL LIFTING INJECTION HEMOSTASIS CLIP PLACEMENT POLYPECTOMY     Patient location during evaluation: Endoscopy Anesthesia Type: MAC Level of consciousness: awake and alert Pain management: pain level controlled Vital Signs Assessment: post-procedure vital signs reviewed and stable Respiratory status: spontaneous breathing, nonlabored ventilation, respiratory function stable and patient connected to nasal cannula oxygen Cardiovascular status: blood pressure returned to baseline and stable Postop Assessment: no apparent nausea or vomiting Anesthetic complications: no   No complications documented.  Last Vitals:  Vitals:   11/30/20 1150 11/30/20 1200  BP: 132/61 (!) 147/62  Pulse: (!) 55 (!) 50  Resp: 18 (!) 21  Temp: (!) 36.3 C   SpO2: 100% 100%    Last Pain:  Vitals:   11/30/20 1200  TempSrc:   PainSc: 0-No pain                 Rodolfo Notaro DANIEL

## 2020-11-30 NOTE — Op Note (Signed)
[facility] Patient Name: Ashley Gonzalez Procedure Date: 11/30/2020 MRN: [id number] Attending MD: [name] , MD Date of Birth: [birth year] CSN: [id number] Age: 75 Admit Type: Outpatient Procedure:                Colonoscopy Indications:              Surveillance: Personal history of piecemeal removal                            of adenoma on last colonoscopy (less than 1 year                            ago) Providers:                [name], MD, Jeanella Cara, RN,                            Laverda Sorenson, Technician, [facility], CRNA Referring MD:              Medicines:                Monitored Anesthesia Care Complications:            No immediate complications. Estimated Blood Loss:     Estimated blood loss was minimal. Procedure:                Pre-Anesthesia Assessment:                           - Prior to the procedure, a History and Physical                            was performed, and patient medications and                            allergies were reviewed. The patient's tolerance of                            previous anesthesia was also reviewed. The risks                            and benefits of the procedure and the sedation                            options and risks were discussed with the patient.                            All questions were answered, and informed consent                            was obtained. Prior Anticoagulants: The patient has                            taken no previous anticoagulant or antiplatelet  agents. ASA Grade Assessment: III - A patient with                            severe systemic disease. After reviewing the risks                            and benefits, the patient was deemed in                            satisfactory condition to undergo the procedure.                           After obtaining informed consent, the colonoscope                             was passed under direct vision. Throughout the                            procedure, the patient's blood pressure, pulse, and                            oxygen saturations were monitored continuously. The                            PCF-H190DL ([id number] Olympus pediatric colonscope                            was introduced through the anus and advanced to the                            5 cm into the ileum. The colonoscopy was performed                            without difficulty. The patient tolerated the                            procedure. The quality of the bowel preparation was                            fair. The terminal ileum, ileocecal valve,                            appendiceal orifice, and rectum were photographed. Scope In: 10:11:39 AM Scope Out: 11:15:31 AM Scope Withdrawal Time: 0 hours 57 minutes 10 seconds  Total Procedure Duration: 1 hour 3 minutes 52 seconds  Findings:      The digital rectal exam findings include hemorrhoids. Pertinent       negatives include no palpable rectal lesions.      Extensive amounts of semi-liquid semi-solid stool was found in the       entire colon, interfering with visualization. Lavage of the area was       performed using copious amounts, resulting in clearance with fair       visualization.      The terminal ileum and ileocecal valve  appeared normal.      A 30 mm polyp was found in the cecum with evidence of this being within       the prior mucosectomy scar-site. The polyp was mixed and lateral       spreading. There was significant stool in this region that as noted       above required significant lavage to get it to a point of adequacy for       safety purposes for attempt at resection. Preparations were made for       mucosal resection. NBI imaging and White-light endoscopy was done to       demarcate the borders of the lesion. Orise gel was injected to raise the       lesion and more than 80% of it lifted appropriately.  Piecemeal mucosal       resection using a snare was performed. Resection was incomplete however       with areas that did not grab. The resected tissue was retrieved. We       followed up with avulsion of this region that did not lift and this       tissue was retrieved. To prevent bleeding after mucosal resection, four       hemostatic clips were successfully placed (MR conditional). There was no       bleeding at the end of the procedure.      A 5 mm polyp was found in the ascending colon. The polyp was sessile.       The polyp was removed with a cold snare. Resection and retrieval were       complete.      Non-bleeding non-thrombosed hemorrhoids were found during retroflexion,       during perianal exam and during digital exam. The hemorrhoids were Grade       II (internal hemorrhoids that prolapse but reduce spontaneously). Impression:               - Stool in the entire colon leading to a                            preparation of the colon was fair even after                            extensive lavage.                           - Hemorrhoids found on digital rectal exam.                           - The examined portion of the ileum was normal.                           - One 30 mm polyp in the cecum within the previous                            scar site, removed with piecemeal mucosal resection                            and Avulsion. Resected tissue retrieved. Clips (MR  conditional) were placed.                           - One 5 mm polyp in the ascending colon, removed                            with a cold snare. Resected and retrieved.                           - Non-bleeding non-thrombosed hemorrhoids. Moderate Sedation:      Not Applicable - Patient had care per Anesthesia. Recommendation:           - The patient will be observed post-procedure,                            until all discharge criteria are met.                           - Discharge  patient to home.                           - Patient has a contact number available for                            emergencies. The signs and symptoms of potential                            delayed complications were discussed with the                            patient. Return to normal activities tomorrow.                            Written discharge instructions were provided to the                            patient.                           - High fiber diet.                           - Use FiberCon 1-2 tablets PO daily.                           - Continue present medications.                           - Await pathology results.                           - Repeat colonoscopy within 9 months for                            surveillance.                           -  The findings and recommendations were discussed                            with the patient.                           - The findings and recommendations were discussed                            with the patient's family. Procedure Code(s):        --- Professional ---                           [phone], Colonoscopy, flexible; with endoscopic                            mucosal resection                           [id number], 55, Colonoscopy, flexible; with removal of                            tumor(s), polyp(s), or other lesion(s) by snare                            technique Diagnosis Code(s):        --- Professional ---                           K64.1, Second degree hemorrhoids                           K63.5, Polyp of colon                           Z09, Encounter for follow-up examination after                            completed treatment for conditions other than                            malignant neoplasm                           Z86.010, Personal history of colonic polyps CPT copyright 2019 [employer]. All rights reserved. The codes documented in this report are preliminary and upon coder review  may  be revised to meet current compliance requirements. [name], MD 11/30/2020 11:36:39 AM Number of Addenda: 0

## 2020-12-01 ENCOUNTER — Encounter (HOSPITAL_COMMUNITY): Payer: Self-pay | Admitting: Gastroenterology

## 2020-12-01 LAB — SURGICAL PATHOLOGY

## 2020-12-03 ENCOUNTER — Encounter: Payer: Self-pay | Admitting: Gastroenterology

## 2020-12-03 ENCOUNTER — Telehealth: Payer: Self-pay | Admitting: Gastroenterology

## 2020-12-03 NOTE — Telephone Encounter (Signed)
Patient called and said she was just looking over her discharge paperwork and it states she has to take Fibercon 1-2 tablets a day but no one advise of it. Has questions is not sure if she was suppose to get it or was it sent to pharmacy.

## 2020-12-03 NOTE — Telephone Encounter (Signed)
The pt has been advised that the fiber con is OTC.  She will pick that up today and call back with any further concerns.

## 2020-12-31 DIAGNOSIS — Z6820 Body mass index (BMI) 20.0-20.9, adult: Secondary | ICD-10-CM | POA: Diagnosis not present

## 2021-04-06 ENCOUNTER — Other Ambulatory Visit: Payer: Self-pay | Admitting: Family Medicine

## 2021-04-06 DIAGNOSIS — Z1231 Encounter for screening mammogram for malignant neoplasm of breast: Secondary | ICD-10-CM

## 2021-05-03 ENCOUNTER — Telehealth: Payer: Self-pay | Admitting: Gastroenterology

## 2021-05-03 NOTE — Telephone Encounter (Signed)
Patient called requesting to schedule another colonoscopy not sure if Dr. Rush Landmark wants to do another at this time.

## 2021-05-04 NOTE — Telephone Encounter (Signed)
The pt was called and she tells me that she prefers to speak with Dr Rush Landmark prior to setting up a colon at this time (per recall) she says that she has improved and would prefer not to have another colon if possible.  Appt made for 11/9 to discuss.

## 2021-05-12 ENCOUNTER — Other Ambulatory Visit: Payer: Self-pay

## 2021-05-12 ENCOUNTER — Ambulatory Visit
Admission: RE | Admit: 2021-05-12 | Discharge: 2021-05-12 | Disposition: A | Discharge status: Home / Self Care | Payer: Medicare Other | Source: Ambulatory Visit | Attending: Family Medicine | Admitting: Family Medicine

## 2021-05-12 DIAGNOSIS — Z1231 Encounter for screening mammogram for malignant neoplasm of breast: Secondary | ICD-10-CM | POA: Diagnosis not present

## 2021-06-09 ENCOUNTER — Encounter: Payer: Self-pay | Admitting: Gastroenterology

## 2021-06-09 ENCOUNTER — Ambulatory Visit: Payer: Medicare Other | Admitting: Gastroenterology

## 2021-06-09 VITALS — BP 120/60 | HR 63 | Ht 61.5 in | Wt 102.0 lb

## 2021-06-09 DIAGNOSIS — K635 Polyp of colon: Secondary | ICD-10-CM

## 2021-06-09 DIAGNOSIS — D126 Benign neoplasm of colon, unspecified: Secondary | ICD-10-CM | POA: Diagnosis not present

## 2021-06-09 DIAGNOSIS — K5909 Other constipation: Secondary | ICD-10-CM

## 2021-06-09 MED ORDER — PLENVU 140 G PO SOLR: 1.0000 | ORAL | 0 refills | Status: DC

## 2021-06-09 NOTE — Patient Instructions (Addendum)
We have sent the following medications to your pharmacy for you to pick up at your convenience: Plenuv   You have been scheduled for a colonoscopy. Please follow written instructions given to you at your visit today.  Please pick up your prep supplies at the pharmacy within the next 1-3 days. If you use inhalers (even only as needed), please bring them with you on the day of your procedure.  If you are age 76 or older, your body mass index should be between 23-30. Your Body mass index is 18.96 kg/m. If this is out of the aforementioned range listed, please consider follow up with your Primary Care Provider.  If you are age 18 or younger, your body mass index should be between 19-25. Your Body mass index is 18.96 kg/m. If this is out of the aformentioned range listed, please consider follow up with your Primary Care Provider.   ________________________________________________________  The [facility] providers would like to encourage you to use [facility] to communicate with providers for non-urgent requests or questions.  Due to long hold times on the telephone, sending your provider a message by [facility] may be a faster and more efficient way to get a response.  Please allow 48 business hours for a response.  Please remember that this is for non-urgent requests.  _______________________________________________________  Thank you for choosing me and [facility].  Dr. Rush Landmark

## 2021-06-09 NOTE — Progress Notes (Addendum)
GASTROENTEROLOGY OUTPATIENT CLINIC VISIT   Primary Care Provider Maury Dus, MD [address] [place] [address] [phone]  Patient Profile: Ashley Gonzalez is a 75 y.o. female with a pmh significant for hyperlipidemia, osteopenia/osteoporosis, chronic constipation, cecal colon adenoma with HGD (status post 2 resection attempts).  The patient presents to the [facility] for an evaluation and management of problem(s) noted below:  Problem List 1. Tubular adenoma of colon   2. High grade dysplasia in colonic adenoma   3. Cecal polyp   4. Chronic constipation      History of Present Illness Please see prior progress notes for full details of HPI.  Interval History The patient returns in effort of transitioning her care from [facility] to [place] as her previous gastroenterologist has retired.  I have known the patient for the last 2 years in the setting of a large cecal polyp that had initially been referred to me.  Given performed 2 colonoscopies within the last year in effort of trying to remove this completely.  Unfortunately she had recurrence on her first follow-up which required recurrent mucosal resection.  There has been some concern from her partner as to whether she would want to return for additional colonoscopies and so this clinic visit was set up to discuss things further.  Overall since the initiation of FiberCon she is using the restroom 2-4 times daily with a formed bowel movement.  She feels significantly better by having bowel movements on a regular basis.  The patient states that she certainly is willing to perform further colonoscopies if it is felt to be in her best interest she just wanted opportunity to see and talk with me further.  She denies any blood in her stools.  She has no abdominal pain or other GI symptoms currently.  GI Review of Systems Positive as above Negative for odynophagia, dysphagia, pyrosis, nausea,  vomiting, abdominal pain, bloating  Review of Systems General: Denies fevers/chills/unintentional weight loss Cardiovascular: Denies chest pain Pulmonary: Denies shortness of breath Gastroenterological: See HPI Genitourinary: Denies darkened urine Hematological: Denies easy bruising/bleeding Dermatological: Denies jaundice Psychological: Mood is stable   Medications Current Outpatient Medications  Medication Sig Dispense Refill   alendronate (FOSAMAX) 70 MG tablet Take 70 mg by mouth every Friday. Take with a full glass of water on an empty stomach.     Calcium Carb-Cholecalciferol (CALCIUM+D3) 600-800 MG-UNIT TABS Take 1 tablet by mouth daily.     IRON PO Take 65 mcg by mouth daily.      Multiple Vitamin (MULTIVITAMIN) tablet Take 1 tablet by mouth daily.     naproxen sodium (ALEVE) 220 MG tablet Take 220-440 mg by mouth daily as needed (Pain).     PEG-KCl-NaCl-NaSulf-Na Asc-C (PLENVU) 140 g SOLR Take 1 kit by mouth as directed. Use coupon: BIN: [id number] PNC: CNRX Group: [id number] ID: [id number] 1 each 0   rosuvastatin (CRESTOR) 20 MG tablet Take 20 mg by mouth daily.      No current facility-administered medications for this visit.    Allergies No Known Allergies  Histories Past Medical History:  Diagnosis Date   Anemia    Arthritis    Hyperlipidemia    Tubular adenoma of colon    Past Surgical History:  Procedure Laterality Date   COLONOSCOPY  08/2019   COLONOSCOPY WITH PROPOFOL N/A 02/10/2020   Procedure: COLONOSCOPY WITH PROPOFOL;  Surgeon: Rush Landmark Telford Nab., MD;  Location: [place];  Service: Gastroenterology;  Laterality:  N/A;   COLONOSCOPY WITH PROPOFOL N/A 11/30/2020   Procedure: COLONOSCOPY WITH PROPOFOL;  Surgeon: Rush Landmark Telford Nab., MD;  Location: [facility];  Service: Gastroenterology;  Laterality: N/A;   ENDOSCOPIC MUCOSAL RESECTION N/A 02/10/2020   Procedure: ENDOSCOPIC MUCOSAL RESECTION;  Surgeon: Rush Landmark Telford Nab., MD;  Location: [place];  Service: Gastroenterology;  Laterality: N/A;   ENDOSCOPIC MUCOSAL RESECTION N/A 11/30/2020   Procedure: ENDOSCOPIC MUCOSAL RESECTION;  Surgeon: Rush Landmark Telford Nab., MD;  Location: [facility];  Service: Gastroenterology;  Laterality: N/A;   HEMOSTASIS CLIP PLACEMENT  02/10/2020   Procedure: HEMOSTASIS CLIP PLACEMENT;  Surgeon: Irving Copas., MD;  Location: [place];  Service: Gastroenterology;;   HEMOSTASIS CLIP PLACEMENT  11/30/2020   Procedure: HEMOSTASIS CLIP PLACEMENT;  Surgeon: Irving Copas., MD;  Location: [facility];  Service: Gastroenterology;;   POLYPECTOMY  11/30/2020   Procedure: POLYPECTOMY;  Surgeon: Irving Copas., MD;  Location: [facility];  Service: Gastroenterology;;   SUBMUCOSAL LIFTING INJECTION  02/10/2020   Procedure: SUBMUCOSAL LIFTING INJECTION;  Surgeon: Irving Copas., MD;  Location: [place];  Service: Gastroenterology;;   SUBMUCOSAL LIFTING INJECTION  11/30/2020   Procedure: SUBMUCOSAL LIFTING INJECTION;  Surgeon: Irving Copas., MD;  Location: [facility];  Service: Gastroenterology;;   TUBAL LIGATION     WISDOM TOOTH EXTRACTION     Social History   Socioeconomic History   Marital status: Married    Spouse name: Not on file   Number of children: 1   Years of education: Not on file   Highest education level: Not on file  Occupational History   Not on file  Tobacco Use   Smoking status: Never   Smokeless tobacco: Never  Vaping Use   Vaping Use: Never used  Substance and Sexual Activity   Alcohol use: No   Drug use: Never   Sexual activity: Never    Birth control/protection: Surgical    Comment: Tubal Ligation  Other Topics Concern   Not on file  Social History Narrative   Not on file   Social Determinants of Health   Financial Resource Strain: Not on file  Food Insecurity: Not on file  Transportation Needs: Not on file  Physical Activity: Not on file  Stress: Not on file   Social Connections: Not on file  Intimate Partner Violence: Not on file   Family History  Problem Relation Age of Onset   Diabetes Sister    Breast cancer Sister        Age 44   Diabetes Brother    Colon polyps Brother    Diabetes Sister    Diabetes Brother    Breast cancer Sister    Colon cancer Neg Hx    Esophageal cancer Neg Hx    Stomach cancer Neg Hx    Pancreatic cancer Neg Hx    Liver disease Neg Hx    Inflammatory bowel disease Neg Hx    Rectal cancer Neg Hx    I have reviewed her medical, social, and family history in detail and updated the electronic medical record as necessary.    PHYSICAL EXAMINATION  BP 120/60   Pulse 63   Ht 5' 1.5" (1.562 m)   Wt 102 lb (46.3 kg)   LMP  (LMP Unknown)   BMI 18.96 kg/m  Wt Readings from Last 3 Encounters:  06/09/21 102 lb (46.3 kg)  11/30/20 115 lb (52.2 kg)  02/10/20 120 lb (54.4 kg)  GEN: NAD, appears stated age, doesn't appear chronically  ill PSYCH: Cooperative, without pressured speech EYE: Conjunctivae pink, sclerae anicteric ENT: MMM CV: Nontachycardic RESP: No audible wheezing GI: NABS, soft, NT/ND, without rebound or guarding MSK/EXT: No lower extremity edema SKIN: No jaundice NEURO:  Alert & Oriented x 3, no focal deficits   REVIEW OF DATA  I reviewed the following data at the time of this encounter:  GI Procedures and Studies  July 2021 colonoscopy - Hemorrhoids found on digital rectal exam. - Tortuous colon. - The examined portion of the ileum was normal. - One 45 mm polyp in the cecum (spanning 50% of the cecal base, removed with mucosal resection. Resected and retrieved. Clips (MR conditional) were placed though complete closure not possible due to size of defect/resection site. - One 20 mm polyp in the proximal ascending colon, removed with mucosal resection. Resected and retrieved. Clips (MR conditional) were placed. - The rest of examined colon is normal appearing - though indepth evaluation  not performed due to intent of today's procedure. - Non-bleeding non-thrombosed internal hemorrhoids.  Pathology FINAL MICROSCOPIC DIAGNOSIS:  A. COLON, CECUM POLYP, ENDOSCOPIC MUCOSAL RESECTION:  - Tubular adenoma with multifocal high grade dysplasia.  B. COLON, ASCENDING COLON POLYP, ENDOSCOPIC MUCOSAL RESECTION:  - Tubular adenoma without high grade dysplasia.  COMMENT:  A. The specimen was received as multiple fragments precluding accurate  assessment of the margin.  An invasive component is not identified in  these fragments.   May 2022 colonoscopy - Stool in the entire colon leading to a preparation of the colon was fair even after extensive lavage. - Hemorrhoids found on digital rectal exam. - The examined portion of the ileum was normal. - One 30 mm polyp in the cecum within the previous scar site, removed with piecemeal mucosal resection and Avulsion. Resected tissue retrieved. Clips (MR conditional) were placed. - One 5 mm polyp in the ascending colon, removed with a cold snare. Resected and retrieved. - Non-bleeding non-thrombosed hemorrhoids.  Pathology FINAL MICROSCOPIC DIAGNOSIS:  A. COLON, CECUM, ENDOSCOPIC MUCOSAL RESECTION OF POLYP:  - Tubular adenoma with focal high-grade dysplasia.  B. COLON, ASCENDING, POLYPECTOMY:  - Tubular adenoma without high-grade dysplasia.  COMMENT:  A. The specimen is received as multiple fragments precluding accurate  assessment of the margin.   Laboratory Studies  Reviewed those in epic  Imaging Studies  No relevant studies to review   ASSESSMENT  Ms. Deist is a 75 y.o. female with a pmh significant for hyperlipidemia, osteopenia/osteoporosis, chronic constipation, cecal colon adenoma with HGD (status post 2 resection attempts).  The patient is seen today for evaluation and management of:  1. Tubular adenoma of colon   2. High grade dysplasia in colonic adenoma   3. Cecal polyp   4. Chronic constipation    The patient  is clinically and hemodynamically stable at this time.  The patient is due in the next few months for follow-up of her piecemeal cecal polyp adenoma resection.  Her to resections have shown evidence of high-grade dysplasia which increases her risk overall in regards to the possibility of recurrence or development of more progressive invasiveness.  After discussion with the patient she does agree to move forward with the planned follow-up.  I spoke with her again that in the setting of working on advanced polyps, if she continues to have this polyp recur and we work on it 1 or 2 more times the likelihood of this polyp needing to just undergo surgical resection becomes much higher in chance.  She is very healthy  otherwise and so I certainly think that this may be an outcome that is possible for her but hopefully our last resection will be enough to help prevent recurrence.  Time will tell.  We will get her scheduled for later this year versus early next year based on her schedule and desire.  She will continue FiberCon as it is greatly helped her bowels and it is okay if she needs to use a generic FiberCon.  I will be prepared for OVESCO full-thickness resection and further avulsion or Endorotor use at the time of her follow-up colonoscopy.  All patient questions were answered to the best of my ability, and the patient agrees to the aforementioned plan of action with follow-up as indicated.   PLAN  Proceed with scheduling colonoscopy with EMR follow-up 90-minute case with FTR D/Endorotor Continue FiberCon once to twice daily   Orders Placed This Encounter  Procedures   Procedural/ Surgical Case Request: COLONOSCOPY WITH PROPOFOL, ENDOSCOPIC MUCOSAL RESECTION   Ambulatory referral to Gastroenterology     New Prescriptions   PEG-KCL-NACL-NASULF-NA ASC-C (PLENVU) 140 G SOLR    Take 1 kit by mouth as directed. Use coupon: BIN: [id number] PNC: CNRX Group: [id number] ID: [id number]   Modified Medications    No medications on file    Planned Follow Up No follow-ups on file.   Total Time in Face-to-Face and in Coordination of Care for patient including independent/personal interpretation/review of prior testing, medical history, examination, medication adjustment, communicating results with the patient directly, and documentation with the EHR is 25 minutes.   [name], MD [facility]oscopy Office # [phone]

## 2021-06-10 DIAGNOSIS — K5909 Other constipation: Secondary | ICD-10-CM | POA: Insufficient documentation

## 2021-06-10 DIAGNOSIS — D126 Benign neoplasm of colon, unspecified: Secondary | ICD-10-CM | POA: Insufficient documentation

## 2021-07-03 IMAGING — CT CT ABD-PELV W/ CM
2 of 5 series · 15 of 46 positions shown, 17 images · IV contrast (APPLIED)
Comparison: None.

CLINICAL DATA: Cecal polyp identified by colonoscopy

EXAM:
CT ABDOMEN AND PELVIS WITH CONTRAST
TECHNIQUE: Multidetector CT imaging of the abdomen and pelvis was performed
using the standard protocol following bolus administration of
intravenous contrast.
CONTRAST:  100mL OMNIPAQUE IOHEXOL 300 MG/ML SOLN, additional oral
enteric contrast

[Series 2: axial st · axial · 0.61mm/px · z∈[-630,-280]mm · 12 of 81 slices shown, 14 images]
[im 6/81  soft-tissue]
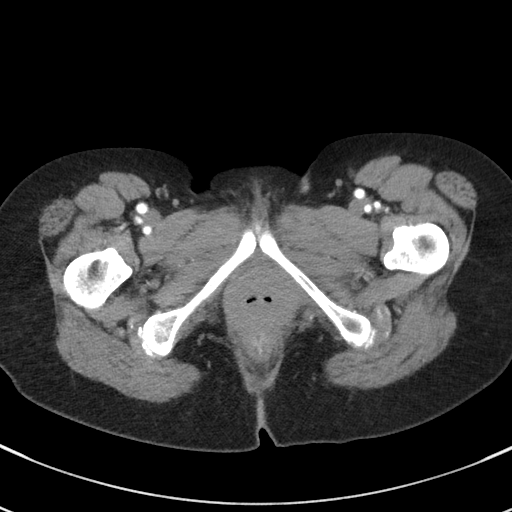
[im 6/81  bone]
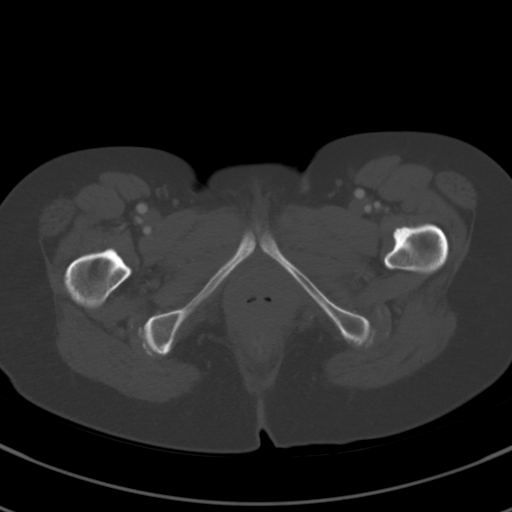
[im 11/81  soft-tissue]
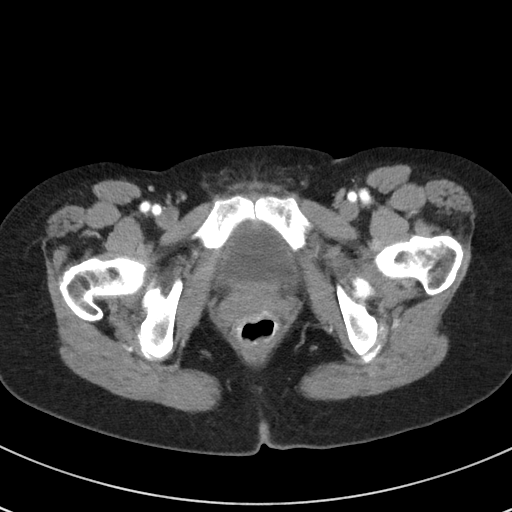
[im 21/81  soft-tissue]
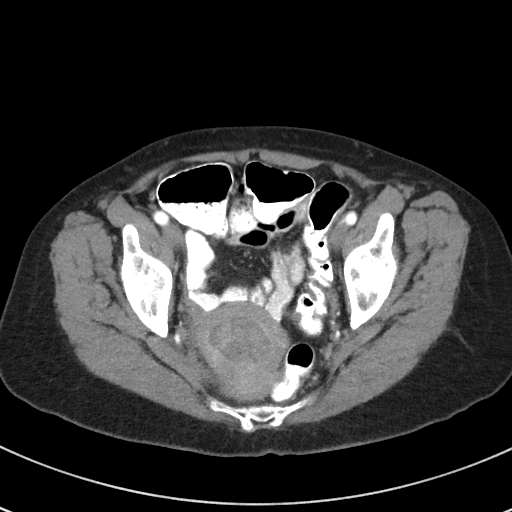
[im 26/81  soft-tissue]
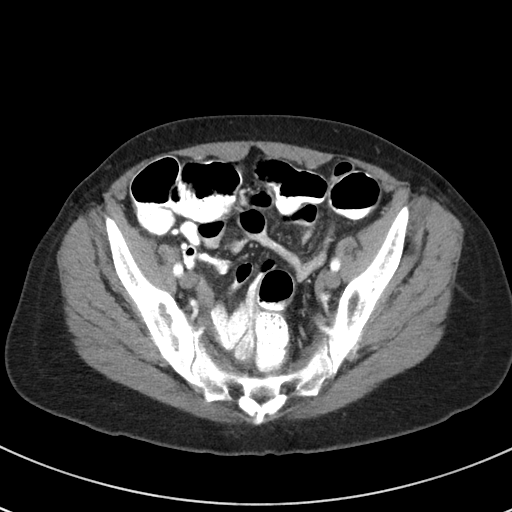
[im 31/81  soft-tissue]
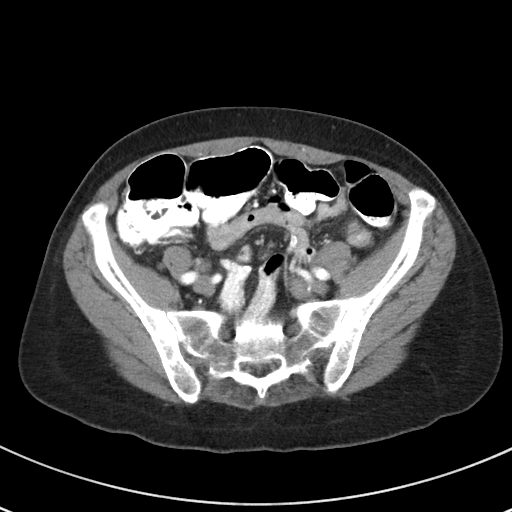
[im 36/81  soft-tissue]
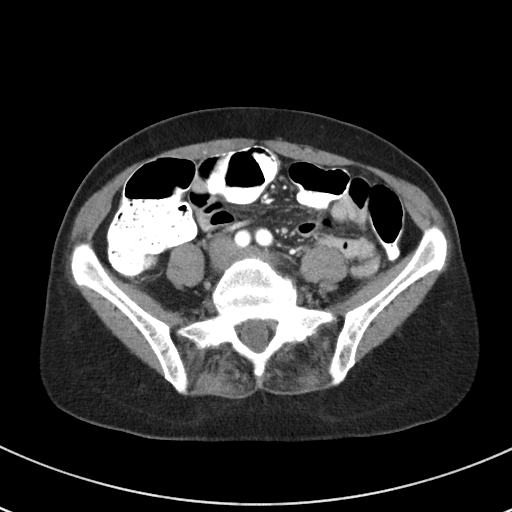
[im 46/81  soft-tissue]
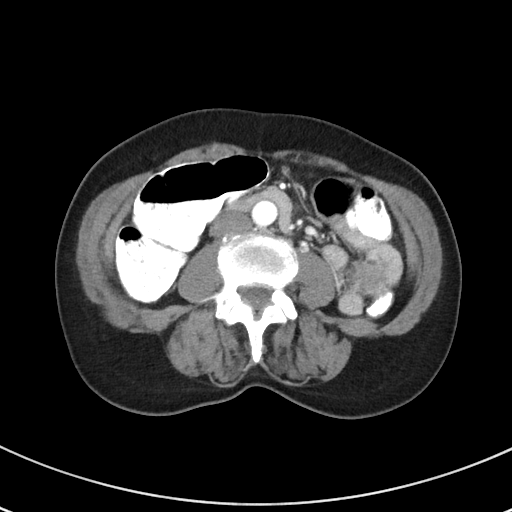
[im 51/81  soft-tissue]
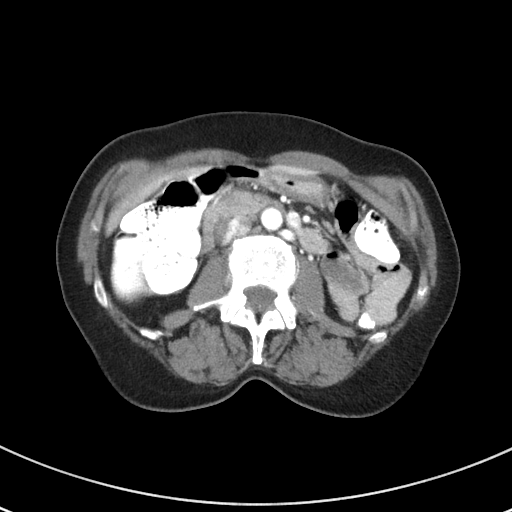
[im 56/81  soft-tissue]
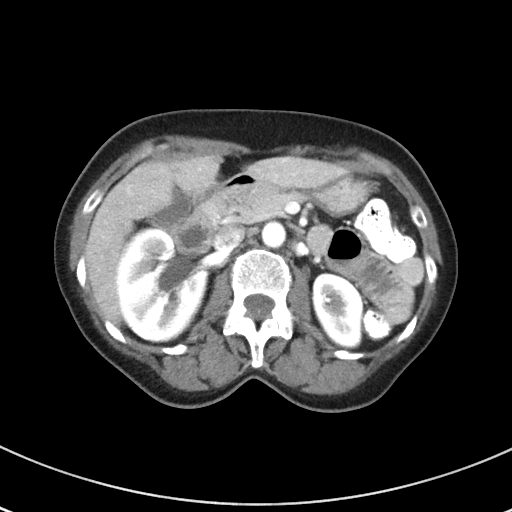
[im 56/81  bone]
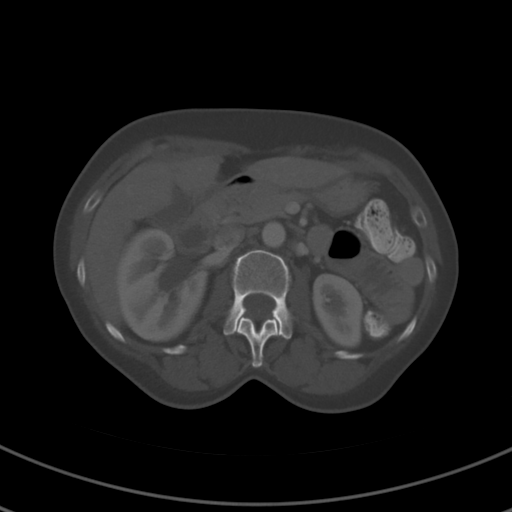
[im 61/81  soft-tissue]
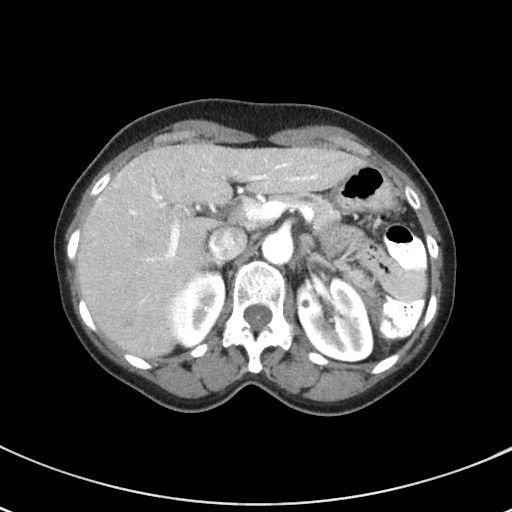
[im 71/81  soft-tissue]
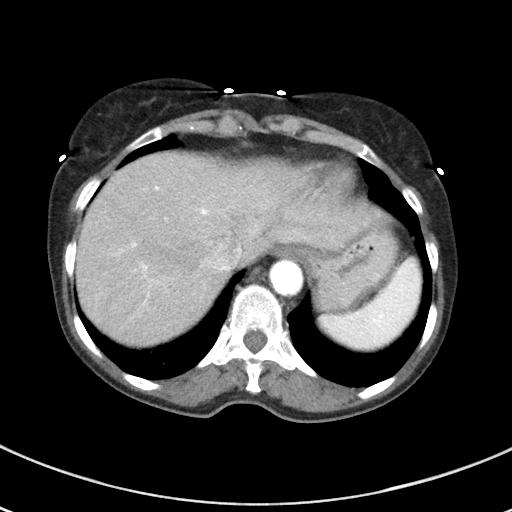
[im 76/81  soft-tissue]
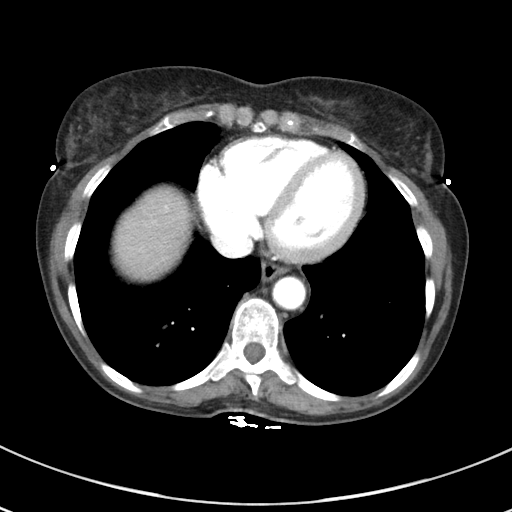

[Series 4: coronal st · coronal · 0.58mm/px · 3 of 74 slices shown]
[im 25/74  soft-tissue]
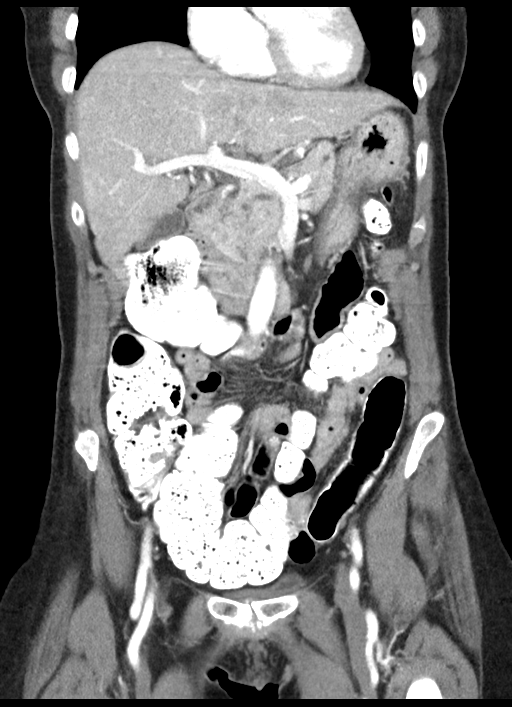
[im 33/74  soft-tissue]
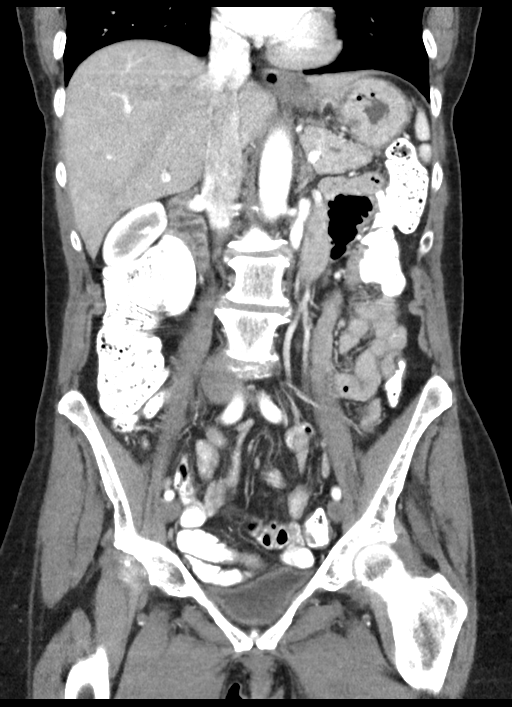
[im 41/74  soft-tissue]
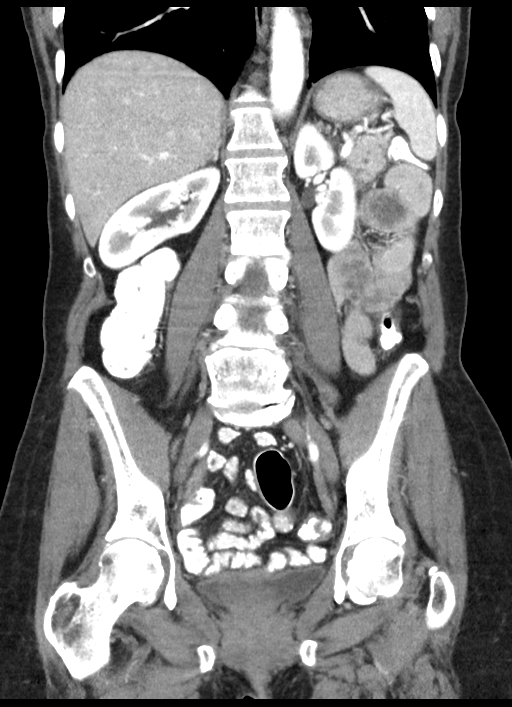

[15 of 46 positions shown; findings below may reference images not displayed]

FINDINGS: Lower chest: No acute abnormality.

Hepatobiliary: No solid liver abnormality is seen. Fluid attenuation
subcentimeter cyst of the posterior right lobe of the liver. No
gallstones, gallbladder wall thickening, or biliary dilatation.

Pancreas: Unremarkable. No pancreatic ductal dilatation or
surrounding inflammatory changes.

Spleen: Normal in size without significant abnormality.

Adrenals/Urinary Tract: Adrenal glands are unremarkable. Kidneys are
normal, without renal calculi, solid lesion, or hydronephrosis.
Bladder is unremarkable.

Stomach/Bowel: Stomach is within normal limits. Appendix appears
normal. No evidence of bowel wall thickening, distention, or
inflammatory changes.

Vascular/Lymphatic: No significant vascular findings are present. No
enlarged abdominal or pelvic lymph nodes.

Reproductive: Retroverted uterus containing at least one fibroid.
There appears to be a mass within the endometrial cavity measuring
4.4 x 2.8 x 3.8 cm (series 5, image 47, series 4, image 53).

Other: No abdominal wall hernia or abnormality. No abdominopelvic
ascites.

Musculoskeletal: No acute or significant osseous findings.
IMPRESSION: 1. No CT abnormality of the colon. No evidence of lymphadenopathy or
metastatic disease in the abdomen or pelvis.

2. Retroverted uterus containing at least one fibroid. There
additionally appears to be a hypodense mass within the endometrial
cavity measuring 4.4 x 2.8 x 3.8 cm. This may reflect a large polyp,
fibroid, or endometrial malignancy. Recommend gynecologic referral
and initial pelvic ultrasound to further evaluate.

These results will be called to the ordering clinician or
representative by the Radiologist Assistant, and communication
documented in the PACS or [REDACTED].

## 2021-08-05 ENCOUNTER — Encounter (HOSPITAL_COMMUNITY): Payer: Self-pay | Admitting: Gastroenterology

## 2021-08-16 ENCOUNTER — Ambulatory Visit (HOSPITAL_COMMUNITY): Payer: No Typology Code available for payment source | Admitting: Anesthesiology

## 2021-08-16 ENCOUNTER — Ambulatory Visit (HOSPITAL_COMMUNITY)
Admission: RE | Admit: 2021-08-16 | Discharge: 2021-08-16 | Disposition: A | Discharge status: Home / Self Care | Payer: No Typology Code available for payment source | Attending: Gastroenterology | Admitting: Gastroenterology

## 2021-08-16 ENCOUNTER — Other Ambulatory Visit: Payer: Self-pay

## 2021-08-16 ENCOUNTER — Encounter (HOSPITAL_COMMUNITY): Payer: Self-pay | Admitting: Gastroenterology

## 2021-08-16 ENCOUNTER — Encounter (HOSPITAL_COMMUNITY)
Admission: RE | Disposition: A | Discharge status: Home / Self Care | Payer: Self-pay | Source: Home / Self Care | Attending: Gastroenterology

## 2021-08-16 DIAGNOSIS — D126 Benign neoplasm of colon, unspecified: Secondary | ICD-10-CM

## 2021-08-16 DIAGNOSIS — K644 Residual hemorrhoidal skin tags: Secondary | ICD-10-CM | POA: Diagnosis not present

## 2021-08-16 DIAGNOSIS — K6289 Other specified diseases of anus and rectum: Secondary | ICD-10-CM | POA: Diagnosis not present

## 2021-08-16 DIAGNOSIS — Z09 Encounter for follow-up examination after completed treatment for conditions other than malignant neoplasm: Secondary | ICD-10-CM | POA: Insufficient documentation

## 2021-08-16 DIAGNOSIS — Z9889 Other specified postprocedural states: Secondary | ICD-10-CM | POA: Insufficient documentation

## 2021-08-16 DIAGNOSIS — D122 Benign neoplasm of ascending colon: Secondary | ICD-10-CM

## 2021-08-16 DIAGNOSIS — K641 Second degree hemorrhoids: Secondary | ICD-10-CM | POA: Insufficient documentation

## 2021-08-16 DIAGNOSIS — Z86010 Personal history of colonic polyps: Secondary | ICD-10-CM | POA: Diagnosis not present

## 2021-08-16 DIAGNOSIS — K635 Polyp of colon: Secondary | ICD-10-CM | POA: Diagnosis not present

## 2021-08-16 DIAGNOSIS — M1990 Unspecified osteoarthritis, unspecified site: Secondary | ICD-10-CM | POA: Diagnosis not present

## 2021-08-16 DIAGNOSIS — D120 Benign neoplasm of cecum: Secondary | ICD-10-CM | POA: Diagnosis not present

## 2021-08-16 HISTORY — PX: ENDOSCOPIC MUCOSAL RESECTION: SHX6839

## 2021-08-16 HISTORY — PX: COLONOSCOPY WITH PROPOFOL: SHX5780

## 2021-08-16 HISTORY — PX: SUBMUCOSAL LIFTING INJECTION: SHX6855

## 2021-08-16 HISTORY — PX: POLYPECTOMY: SHX5525

## 2021-08-16 SURGERY — COLONOSCOPY WITH PROPOFOL
Anesthesia: Monitor Anesthesia Care

## 2021-08-16 MED ORDER — PROPOFOL 500 MG/50ML IV EMUL
INTRAVENOUS | Status: DC | PRN
  Administered 2021-08-16: 125 ug/kg/min via INTRAVENOUS

## 2021-08-16 MED ORDER — PROPOFOL 500 MG/50ML IV EMUL
INTRAVENOUS | Status: DC | PRN
  Administered 2021-08-16: 30 mg via INTRAVENOUS

## 2021-08-16 MED ORDER — LACTATED RINGERS IV SOLN
INTRAVENOUS | Status: DC
  Administered 2021-08-16: 1000 mL via INTRAVENOUS

## 2021-08-16 MED ORDER — PHENYLEPHRINE HCL (PRESSORS) 10 MG/ML IV SOLN
INTRAVENOUS | Status: DC | PRN
  Administered 2021-08-16 (×5): 40 ug via INTRAVENOUS

## 2021-08-16 MED ORDER — SODIUM CHLORIDE 0.9 % IV SOLN: INTRAVENOUS | Status: DC

## 2021-08-16 MED ORDER — LIDOCAINE HCL (CARDIAC) PF 100 MG/5ML IV SOSY
PREFILLED_SYRINGE | INTRAVENOUS | Status: DC | PRN
  Administered 2021-08-16: 40 mg via INTRAVENOUS

## 2021-08-16 SURGICAL SUPPLY — 22 items

## 2021-08-16 NOTE — Anesthesia Preprocedure Evaluation (Addendum)
Anesthesia Evaluation  Patient identified by MRN, date of birth, ID band Patient awake    Reviewed: Allergy & Precautions, NPO status , Patient's Chart, lab work & pertinent test results  History of Anesthesia Complications Negative for: history of anesthetic complications  Airway Mallampati: II  TM Distance: >3 FB Neck ROM: Full    Dental  (+) Partial Upper, Partial Lower   Pulmonary neg pulmonary ROS,    Pulmonary exam normal        Cardiovascular negative cardio ROS Normal cardiovascular exam     Neuro/Psych negative neurological ROS  negative psych ROS   GI/Hepatic negative GI ROS, Neg liver ROS,   Endo/Other  negative endocrine ROS  Renal/GU negative Renal ROS     Musculoskeletal  (+) Arthritis ,   Abdominal   Peds  Hematology negative hematology ROS (+)   Anesthesia Other Findings   Reproductive/Obstetrics                            Anesthesia Physical Anesthesia Plan  ASA: 1  Anesthesia Plan: MAC   Post-op Pain Management:    Induction:   PONV Risk Score and Plan: 2 and Propofol infusion and Treatment may vary due to age or medical condition  Airway Management Planned: Nasal Cannula and Natural Airway  Additional Equipment: None  Intra-op Plan:   Post-operative Plan:   Informed Consent: I have reviewed the patients History and Physical, chart, labs and discussed the procedure including the risks, benefits and alternatives for the proposed anesthesia with the patient or authorized representative who has indicated his/her understanding and acceptance.       Plan Discussed with: CRNA and Anesthesiologist  Anesthesia Plan Comments:        Anesthesia Quick Evaluation

## 2021-08-16 NOTE — H&P (Signed)
GASTROENTEROLOGY PROCEDURE H&P NOTE   Primary Care Physician: Maury Dus, MD  HPI: Ashley Gonzalez is a 76 y.o. female who presents for Colonoscopy for follow up of TA with HGD now s/p 2 attempts are resection with hope that it has not recurred.  Query if it has recurred whether surgical resection could be required vs final attempt at endoscopic approach.  Past Medical History:  Diagnosis Date   Anemia    Arthritis    Hyperlipidemia    Tubular adenoma of colon    Past Surgical History:  Procedure Laterality Date   COLONOSCOPY  08/2019   COLONOSCOPY WITH PROPOFOL N/A 02/10/2020   Procedure: COLONOSCOPY WITH PROPOFOL;  Surgeon: Rush Landmark Telford Nab., MD;  Location: [place];  Service: Gastroenterology;  Laterality: N/A;   COLONOSCOPY WITH PROPOFOL N/A 11/30/2020   Procedure: COLONOSCOPY WITH PROPOFOL;  Surgeon: Rush Landmark Telford Nab., MD;  [facility];  Service: Gastroenterology;  Laterality: N/A;   ENDOSCOPIC MUCOSAL RESECTION N/A 02/10/2020   Procedure: ENDOSCOPIC MUCOSAL RESECTION;  Surgeon: Rush Landmark Telford Nab., MD;  Location: [place];  Service: Gastroenterology;  Laterality: N/A;   ENDOSCOPIC MUCOSAL RESECTION N/A 11/30/2020   Procedure: ENDOSCOPIC MUCOSAL RESECTION;  Surgeon: Rush Landmark Telford Nab., MD;  Location: [facility];  Service: Gastroenterology;  Laterality: N/A;   HEMOSTASIS CLIP PLACEMENT  02/10/2020   Procedure: HEMOSTASIS CLIP PLACEMENT;  Surgeon: Rush Landmark Telford Nab., MD;  Location: [place];  Service: Gastroenterology;;   HEMOSTASIS CLIP PLACEMENT  11/30/2020   Procedure: HEMOSTASIS CLIP PLACEMENT;  Surgeon: Irving Copas., MD;  Location: [facility];  Service: Gastroenterology;;   POLYPECTOMY  11/30/2020   Procedure: POLYPECTOMY;  Surgeon: Irving Copas., MD;  Location: [facility];  Service: Gastroenterology;;   SUBMUCOSAL LIFTING INJECTION  02/10/2020   Procedure: SUBMUCOSAL LIFTING INJECTION;  Surgeon:  Irving Copas., MD;  Location: [place];  Service: Gastroenterology;;   SUBMUCOSAL LIFTING INJECTION  11/30/2020   Procedure: SUBMUCOSAL LIFTING INJECTION;  Surgeon: Irving Copas., MD;  Location: [facility];  Service: Gastroenterology;;   TUBAL LIGATION     WISDOM TOOTH EXTRACTION     Current Facility-Administered Medications  Medication Dose Route Frequency Provider Last Rate Last Admin   0.9 %  sodium chloride infusion   Intravenous Continuous Mansouraty, Telford Nab., MD       lactated ringers infusion   Intravenous Continuous Mansouraty, Telford Nab., MD 10 mL/hr at 08/16/21 1108 1,000 mL at 08/16/21 1108    Current Facility-Administered Medications:    0.9 %  sodium chloride infusion, , Intravenous, Continuous, Mansouraty, Telford Nab., MD   lactated ringers infusion, , Intravenous, Continuous, Mansouraty, Telford Nab., MD, Last Rate: 10 mL/hr at 08/16/21 1108, 1,000 mL at 08/16/21 1108 No Known Allergies Family History  Problem Relation Age of Onset   Diabetes Sister    Breast cancer Sister        Age 59   Diabetes Brother    Colon polyps Brother    Diabetes Sister    Diabetes Brother    Breast cancer Sister    Colon cancer Neg Hx    Esophageal cancer Neg Hx    Stomach cancer Neg Hx    Pancreatic cancer Neg Hx    Liver disease Neg Hx    Inflammatory bowel disease Neg Hx    Rectal cancer Neg Hx    Social History   Socioeconomic History   Marital status: Married    Spouse name: Not on file   Number of children: 1  Years of education: Not on file   Highest education level: Not on file  Occupational History   Not on file  Tobacco Use   Smoking status: Never   Smokeless tobacco: Never  Vaping Use   Vaping Use: Never used  Substance and Sexual Activity   Alcohol use: No   Drug use: Never   Sexual activity: Never    Birth control/protection: Surgical    Comment: Tubal Ligation  Other Topics Concern   Not on file  Social History Narrative    Not on file   Social Determinants of Health   Financial Resource Strain: Not on file  Food Insecurity: Not on file  Transportation Needs: Not on file  Physical Activity: Not on file  Stress: Not on file  Social Connections: Not on file  Intimate Partner Violence: Not on file    Physical Exam: Today's Vitals   08/16/21 1034  BP: 139/61  Pulse: (!) 58  Resp: 18  Temp: 97.8 F (36.6 C)  TempSrc: Temporal  SpO2: 100%  Weight: 47.6 kg  Height: 5\' 1"  (1.549 m)  PainSc: 0-No pain   Body mass index is 19.84 kg/m. GEN: NAD EYE: Sclerae anicteric ENT: MMM CV: Non-tachycardic GI: Soft, NT/ND NEURO:  Alert & Oriented x 3  Lab Results: No results for input(s): WBC, HGB, HCT, PLT in the last 72 hours. BMET No results for input(s): NA, K, CL, CO2, GLUCOSE, BUN, CREATININE, CALCIUM in the last 72 hours. LFT No results for input(s): PROT, ALBUMIN, AST, ALT, ALKPHOS, BILITOT, BILIDIR, IBILI in the last 72 hours. PT/INR No results for input(s): LABPROT, INR in the last 72 hours.   Impression / Plan: This is a 76 y.o.female who presents for Colonoscopy for follow up of TA with HGD now s/p 2 attempts are resection with hope that it has not recurred.  Query if it has recurred whether surgical resection could be required vs final attempt at endoscopic approach.  The risks and benefits of endoscopic evaluation/treatment were discussed with the patient and/or family; these include but are not limited to the risk of perforation, infection, bleeding, missed lesions, lack of diagnosis, severe illness requiring hospitalization, as well as anesthesia and sedation related illnesses.  The patient's history has been reviewed, patient examined, no change in status, and deemed stable for procedure.  The patient and/or family is agreeable to proceed.    [name], MD [facility] Advanced [facility]oscopy Office # [phone]

## 2021-08-16 NOTE — Discharge Instructions (Signed)
YOU HAD AN ENDOSCOPIC PROCEDURE TODAY: Refer to the procedure report and other information in the discharge instructions given to you for any specific questions about what was found during the examination. If this information does not answer your questions, please call [place] office at [phone] to clarify.  ° °YOU SHOULD EXPECT: Some feelings of bloating in the abdomen. Passage of more gas than usual. Walking can help get rid of the air that was put into your GI tract during the procedure and reduce the bloating. If you had a lower endoscopy (such as a colonoscopy or flexible sigmoidoscopy) you may notice spotting of blood in your stool or on the toilet paper. Some abdominal soreness may be present for a day or two, also. ° °DIET: Your first meal following the procedure should be a light meal and then it is ok to progress to your normal diet. A half-sandwich or bowl of soup is an example of a good first meal. Heavy or fried foods are harder to digest and may make you feel nauseous or bloated. Drink plenty of fluids but you should avoid alcoholic beverages for 24 hours. If you had a esophageal dilation, please see attached instructions for diet.   ° °ACTIVITY: Your care partner should take you home directly after the procedure. You should plan to take it easy, moving slowly for the rest of the day. You can resume normal activity the day after the procedure however YOU SHOULD NOT DRIVE, use power tools, machinery or perform tasks that involve climbing or major physical exertion for 24 hours (because of the sedation medicines used during the test).  ° °SYMPTOMS TO REPORT IMMEDIATELY: °A gastroenterologist can be reached at any hour. Please call [phone]  for any of the following symptoms:  °Following lower endoscopy (colonoscopy, flexible sigmoidoscopy) °Excessive amounts of blood in the stool  °Significant tenderness, worsening of abdominal pains  °Swelling of the abdomen that is new, acute  °Fever of 100° or  higher  °Following upper endoscopy (EGD, EUS, ERCP, esophageal dilation) °Vomiting of blood or coffee ground material  °New, significant abdominal pain  °New, significant chest pain or pain under the shoulder blades  °Painful or persistently difficult swallowing  °New shortness of breath  °Black, tarry-looking or red, bloody stools ° °FOLLOW UP:  °If any biopsies were taken you will be contacted by phone or by letter within the next 1-3 weeks. Call [phone]  if you have not heard about the biopsies in 3 weeks.  °Please also call with any specific questions about appointments or follow up tests. ° °

## 2021-08-16 NOTE — Anesthesia Postprocedure Evaluation (Signed)
Anesthesia Post Note  Patient: Ashley Gonzalez  Procedure(s) Performed: COLONOSCOPY WITH PROPOFOL ENDOSCOPIC MUCOSAL RESECTION SUBMUCOSAL LIFTING INJECTION POLYPECTOMY     Patient location during evaluation: PACU Anesthesia Type: MAC Level of consciousness: awake and alert Pain management: pain level controlled Vital Signs Assessment: post-procedure vital signs reviewed and stable Respiratory status: spontaneous breathing, nonlabored ventilation and respiratory function stable Cardiovascular status: stable and blood pressure returned to baseline Anesthetic complications: no   No notable events documented.  Last Vitals:  Vitals:   08/16/21 1401 08/16/21 1411  BP: (!) 147/65 126/67  Pulse: (!) 55 (!) 54  Resp: 16 17  Temp:    SpO2: 97% 98%    Last Pain:  Vitals:   08/16/21 1411  TempSrc:   PainSc: 0-No pain                 Audry Pili

## 2021-08-16 NOTE — Transfer of Care (Signed)
Immediate Anesthesia Transfer of Care Note  Patient: MISCHELL BRANFORD  Procedure(s) Performed: Procedure(s): COLONOSCOPY WITH PROPOFOL (N/A) ENDOSCOPIC MUCOSAL RESECTION (N/A) SUBMUCOSAL LIFTING INJECTION POLYPECTOMY  Patient Location: PACU  Anesthesia Type:MAC  Level of Consciousness:  sedated, patient cooperative and responds to stimulation  Airway & Oxygen Therapy:Patient Spontanous Breathing and Patient connected to face mask oxgen  Post-op Assessment:  Report given to PACU RN and Post -op Vital signs reviewed and stable  Post vital signs:  Reviewed and stable  Last Vitals:  Vitals:   08/16/21 1034 08/16/21 1341  BP: 139/61 (!) 111/54  Pulse: (!) 58 (!) 56  Resp: 18 13  Temp: 36.6 C 36.4 C  SpO2: 852% 77%    Complications: No apparent anesthesia complications

## 2021-08-16 NOTE — Op Note (Signed)
[facility] Patient Name: Ashley Gonzalez Procedure Date: 08/16/2021 MRN: [id number] Attending MD: [name] , MD Date of Birth: [birth year] CSN: [id number] Age: 76 Admit Type: Outpatient Procedure:                Colonoscopy Indications:              Surveillance: Personal history of piecemeal removal                            of adenoma on last colonoscopy 6 months ago Providers:                [name], MD, Truddie Coco, RN, Cherylynn Ridges, Technician Referring MD:              Medicines:                Monitored Anesthesia Care Complications:            No immediate complications. Estimated Blood Loss:     Estimated blood loss was minimal. Procedure:                Pre-Anesthesia Assessment:                           - Prior to the procedure, a History and Physical                            was performed, and patient medications and                            allergies were reviewed. The patient's tolerance of                            previous anesthesia was also reviewed. The risks                            and benefits of the procedure and the sedation                            options and risks were discussed with the patient.                            All questions were answered, and informed consent                            was obtained. Prior Anticoagulants: The patient has                            taken no previous anticoagulant or antiplatelet                            agents. ASA Grade Assessment: III - A patient with  severe systemic disease. After reviewing the risks                            and benefits, the patient was deemed in                            satisfactory condition to undergo the procedure.                           After obtaining informed consent, the colonoscope                            was passed under direct vision. Throughout the                             procedure, the patient's blood pressure, pulse, and                            oxygen saturations were monitored continuously. The                            CF-HQ190L ([id number]) Olympus colonoscope was                            introduced through the anus and advanced to the 5                            cm into the ileum. The colonoscopy was performed                            without difficulty. The patient tolerated the                            procedure. The quality of the bowel preparation was                            good. The terminal ileum, ileocecal valve,                            appendiceal orifice, and rectum were photographed. Scope In: 12:44:01 PM Scope Out: 1:33:24 PM Scope Withdrawal Time: 0 hours 42 minutes 49 seconds  Total Procedure Duration: 0 hours 49 minutes 23 seconds  Findings:      The digital rectal exam findings include hemorrhoids. Pertinent       negatives include no palpable rectal lesions.      The terminal ileum and ileocecal valve appeared normal.      A large post mucosectomy scar was found in the cecum. There was residual       polyp tissue much less than prior but still present/recurrent.       Preparations were made for mucosal resection. NBI imaging and       White-light endoscopy was done to mark the borders of the lesion.       Olympus Endolift was injected to raise the lesion and this was partially  successful.. Piecemeal mucosal resection using a snare was performed.       Resection was incomplete however. The resected tissue was retrieved.       Fulguration to ablate the lesion remnants by forcep avulsion was       performed and was successful. To prevent bleeding after this resection,       four hemostatic clips were successfully placed (MR conditional). There       was no bleeding at the end of the procedure.      A 3 mm polyp was found in the ascending colon. The polyp was sessile.       The polyp was removed with a cold snare.  Resection and retrieval were       complete.      Normal mucosa was found in the entire colon otherwise.      Non-bleeding non-thrombosed external and internal hemorrhoids were found       during retroflexion, during perianal exam and during digital exam. The       hemorrhoids were Grade II (internal hemorrhoids that prolapse but reduce       spontaneously). Impression:               - Hemorrhoids found on digital rectal exam.                           - The examined portion of the ileum was normal.                           - Post mucosectomy scar in the cecum. Snare                            piecemeal resection and forcep avulsion techniques                            used. Clips (MR conditional) were placed.                           - One 3 mm polyp in the ascending colon, removed                            with a cold snare. Resected and retrieved.                           - Normal mucosa in the entire examined colon                            otherwise.                           - Non-bleeding non-thrombosed external and internal                            hemorrhoids. Moderate Sedation:      Not Applicable - Patient had care per Anesthesia. Recommendation:           - The patient will be observed post-procedure,  until all discharge criteria are met.                           - Discharge patient to home.                           - High fiber diet.                           - Monitor for signs/symptoms of bleeding,                            perforation, and infection. If issues please call                            our number to get further assistance as needed.                           - Continue present medications.                           - Await pathology results.                           - Repeat colonoscopy in 6-9 months for                            surveillance. But if there is any recurrence at                            that time will  need to consider surgical resection                            most likely.                           - The findings and recommendations were discussed                            with the patient.                           - The findings and recommendations were discussed                            with the patient's family. Procedure Code(s):        --- Professional ---                           [phone], Colonoscopy, flexible; with endoscopic                            mucosal resection                           45385, 52, Colonoscopy, flexible; with removal of  tumor(s), polyp(s), or other lesion(s) by snare                            technique Diagnosis Code(s):        --- Professional ---                           K64.1, Second degree hemorrhoids                           Z98.890, Other specified postprocedural states                           K63.5, Polyp of colon                           Z09, Encounter for follow-up examination after                            completed treatment for conditions other than                            malignant neoplasm                           Z86.010, Personal history of colonic polyps CPT copyright 2019 [employer] Association. All rights reserved. The codes documented in this report are preliminary and upon coder review may  be revised to meet current compliance requirements. [name], MD 08/16/2021 2:01:54 PM Number of Addenda: 0

## 2021-08-17 ENCOUNTER — Encounter (HOSPITAL_COMMUNITY): Payer: Self-pay | Admitting: Gastroenterology

## 2021-08-17 LAB — SURGICAL PATHOLOGY

## 2021-08-20 ENCOUNTER — Encounter: Payer: Self-pay | Admitting: Gastroenterology

## 2021-11-17 DIAGNOSIS — E2839 Other primary ovarian failure: Secondary | ICD-10-CM | POA: Diagnosis not present

## 2021-11-17 DIAGNOSIS — M1991 Primary osteoarthritis, unspecified site: Secondary | ICD-10-CM | POA: Diagnosis not present

## 2021-11-17 DIAGNOSIS — R030 Elevated blood-pressure reading, without diagnosis of hypertension: Secondary | ICD-10-CM | POA: Diagnosis not present

## 2021-11-17 DIAGNOSIS — E7800 Pure hypercholesterolemia, unspecified: Secondary | ICD-10-CM | POA: Diagnosis not present

## 2021-11-17 DIAGNOSIS — M810 Age-related osteoporosis without current pathological fracture: Secondary | ICD-10-CM | POA: Diagnosis not present

## 2021-11-19 ENCOUNTER — Other Ambulatory Visit: Payer: Self-pay | Admitting: Family Medicine

## 2021-11-19 DIAGNOSIS — E2839 Other primary ovarian failure: Secondary | ICD-10-CM

## 2021-12-17 ENCOUNTER — Encounter: Payer: Self-pay | Admitting: Gastroenterology

## 2022-02-16 ENCOUNTER — Other Ambulatory Visit (INDEPENDENT_AMBULATORY_CARE_PROVIDER_SITE_OTHER): Payer: No Typology Code available for payment source

## 2022-02-16 ENCOUNTER — Ambulatory Visit (INDEPENDENT_AMBULATORY_CARE_PROVIDER_SITE_OTHER): Payer: No Typology Code available for payment source | Admitting: Gastroenterology

## 2022-02-16 ENCOUNTER — Encounter: Payer: Self-pay | Admitting: Gastroenterology

## 2022-02-16 VITALS — BP 122/68 | HR 80 | Ht 61.0 in | Wt 99.6 lb

## 2022-02-16 DIAGNOSIS — K5909 Other constipation: Secondary | ICD-10-CM | POA: Diagnosis not present

## 2022-02-16 DIAGNOSIS — D126 Benign neoplasm of colon, unspecified: Secondary | ICD-10-CM

## 2022-02-16 DIAGNOSIS — K635 Polyp of colon: Secondary | ICD-10-CM | POA: Diagnosis not present

## 2022-02-16 LAB — PROTIME-INR
INR: 1 ratio (ref 0.8–1.0)
Prothrombin Time: 10.8 s (ref 9.6–13.1)

## 2022-02-16 LAB — BASIC METABOLIC PANEL
BUN: 11 mg/dL (ref 6–23)
CO2: 29 mEq/L (ref 19–32)
Calcium: 10.9 mg/dL — ABNORMAL HIGH (ref 8.4–10.5)
Chloride: 103 mEq/L (ref 96–112)
Creatinine, Ser: 0.62 mg/dL (ref 0.40–1.20)
GFR: 86.2 mL/min (ref >60.00)
Glucose, Bld: 94 mg/dL (ref 70–99)
Potassium: 3.9 mEq/L (ref 3.5–5.1)
Sodium: 140 mEq/L (ref 135–145)

## 2022-02-16 LAB — CBC
HCT: 40.7 % (ref 36.0–46.0)
Hemoglobin: 13.8 g/dL (ref 12.0–15.0)
MCHC: 34 g/dL (ref 30.0–36.0)
MCV: 89.3 fl (ref 78.0–100.0)
Platelets: 334 10*3/uL (ref 150.0–400.0)
RBC: 4.55 Mil/uL (ref 3.87–5.11)
RDW: 13 % (ref 11.5–15.5)
WBC: 5.8 10*3/uL (ref 4.0–10.5)

## 2022-02-16 NOTE — Progress Notes (Signed)
GASTROENTEROLOGY OUTPATIENT CLINIC VISIT   Primary Care Provider Maury Dus, MD [address] [place] [address] [phone]  Patient Profile: KYSA CALAIS is a 76 y.o. female with a pmh significant for hyperlipidemia, osteopenia/osteoporosis, chronic constipation, cecal colon adenoma with HGD (status post 3 resection attempts).  The patient presents to the [facility] for an evaluation and management of problem(s) noted below:  Problem List 1. Cecal polyp   2. High grade dysplasia in colonic adenoma   3. Chronic constipation     History of Present Illness Please see prior notes for full details of HPI.  Interval History The patient returns for follow-up.  The patient states that she has continued to do well from a chronic constipation standpoint with her FiberCon use.  She has a bowel movement on a near daily basis.  No blood is been noted in her stools.  She is aware and somewhat concerned about the recurrent nature of this very large cecal polyp that we have attempted resection on 3 prior attempts.  She understands the nature of this being precancerous but with her overall health and expected long life expectancy certainly is an issue if this continues to be present.  She has no other significant GI symptoms at this time.  GI Review of Systems Positive as above Negative for dysphagia, nausea, vomiting, pain, alteration of bowel habits   Review of Systems General: Denies fevers/chills/unintentional weight loss Cardiovascular: Denies chest pain Pulmonary: Denies shortness of breath Gastroenterological: See HPI Genitourinary: Denies darkened urine Hematological: Denies easy bruising/bleeding Dermatological: Denies jaundice Psychological: Mood is stable   Medications Current Outpatient Medications  Medication Sig Dispense Refill   alendronate (FOSAMAX) 70 MG tablet Take 70 mg by mouth every Friday. Take with a full glass of water  on an empty stomach.     Calcium Carb-Cholecalciferol (CALCIUM+D3) 600-800 MG-UNIT TABS Take 1 tablet by mouth in the morning and at bedtime.     IRON PO Take 65 mcg by mouth daily.      Multiple Vitamin (MULTIVITAMIN) tablet Take 1 tablet by mouth daily.     naproxen sodium (ALEVE) 220 MG tablet Take 220-440 mg by mouth daily as needed (Pain).     polycarbophil (FIBERCON) 625 MG tablet Take 1,250 mg by mouth daily.     rosuvastatin (CRESTOR) 20 MG tablet Take 20 mg by mouth daily.      No current facility-administered medications for this visit.    Allergies No Known Allergies  Histories Past Medical History:  Diagnosis Date   Anemia    Arthritis    Hyperlipidemia    Tubular adenoma of colon    Past Surgical History:  Procedure Laterality Date   COLONOSCOPY  08/2019   COLONOSCOPY WITH PROPOFOL N/A 02/10/2020   Procedure: COLONOSCOPY WITH PROPOFOL;  Surgeon: Rush Landmark Telford Nab., MD;  Location: [place];  Service: Gastroenterology;  Laterality: N/A;   COLONOSCOPY WITH PROPOFOL N/A 11/30/2020   Procedure: COLONOSCOPY WITH PROPOFOL;  Surgeon: Rush Landmark Telford Nab., MD;  Location: [facility];  Service: Gastroenterology;  Laterality: N/A;   COLONOSCOPY WITH PROPOFOL N/A 08/16/2021   Procedure: COLONOSCOPY WITH PROPOFOL;  Surgeon: Rush Landmark Telford Nab., MD;  Location: [facility];  Service: Gastroenterology;  Laterality: N/A;   ENDOSCOPIC MUCOSAL RESECTION N/A 02/10/2020   Procedure: ENDOSCOPIC MUCOSAL RESECTION;  Surgeon: Rush Landmark Telford Nab., MD;  Location: [place];  Service: Gastroenterology;  Laterality: N/A;   ENDOSCOPIC MUCOSAL RESECTION N/A 11/30/2020   Procedure: ENDOSCOPIC MUCOSAL RESECTION;  Surgeon: Irving Copas., MD;  Location: [facility];  Service: Gastroenterology;  Laterality: N/A;   ENDOSCOPIC MUCOSAL RESECTION N/A 08/16/2021   Procedure: ENDOSCOPIC MUCOSAL RESECTION;  Surgeon: Rush Landmark Telford Nab., MD;  Location: [facility];  Service:  Gastroenterology;  Laterality: N/A;   HEMOSTASIS CLIP PLACEMENT  02/10/2020   Procedure: HEMOSTASIS CLIP PLACEMENT;  Surgeon: Rush Landmark Telford Nab., MD;  Location: [place];  Service: Gastroenterology;;   HEMOSTASIS CLIP PLACEMENT  11/30/2020   Procedure: HEMOSTASIS CLIP PLACEMENT;  Surgeon: Irving Copas., MD;  Location: [facility];  Service: Gastroenterology;;   POLYPECTOMY  11/30/2020   Procedure: POLYPECTOMY;  Surgeon: Irving Copas., MD;  Location: [facility];  Service: Gastroenterology;;   POLYPECTOMY  08/16/2021   Procedure: POLYPECTOMY;  Surgeon: Irving Copas., MD;  Location: [facility];  Service: Gastroenterology;;   SUBMUCOSAL LIFTING INJECTION  02/10/2020   Procedure: SUBMUCOSAL LIFTING INJECTION;  Surgeon: Irving Copas., MD;  Location: [place];  Service: Gastroenterology;;   SUBMUCOSAL LIFTING INJECTION  11/30/2020   Procedure: SUBMUCOSAL LIFTING INJECTION;  Surgeon: Irving Copas., MD;  Location: [facility];  Service: Gastroenterology;;   SUBMUCOSAL LIFTING INJECTION  08/16/2021   Procedure: SUBMUCOSAL LIFTING INJECTION;  Surgeon: Irving Copas., MD;  Location: [facility];  Service: Gastroenterology;;   TUBAL LIGATION     WISDOM TOOTH EXTRACTION     Social History   Socioeconomic History   Marital status: Married    Spouse name: Not on file   Number of children: 1   Years of education: Not on file   Highest education level: Not on file  Occupational History   Not on file  Tobacco Use   Smoking status: Never   Smokeless tobacco: Never  Vaping Use   Vaping Use: Never used  Substance and Sexual Activity   Alcohol use: No   Drug use: Never   Sexual activity: Never    Birth control/protection: Surgical    Comment: Tubal Ligation  Other Topics Concern   Not on file  Social History Narrative   Not on file   Social Determinants of Health   Financial Resource Strain: Not on file  Food Insecurity: Not  on file  Transportation Needs: Not on file  Physical Activity: Not on file  Stress: Not on file  Social Connections: Not on file  Intimate Partner Violence: Not on file   Family History  Problem Relation Age of Onset   Diabetes Sister    Breast cancer Sister        Age 13   Diabetes Brother    Colon polyps Brother    Diabetes Sister    Diabetes Brother    Breast cancer Sister    Colon cancer Neg Hx    Esophageal cancer Neg Hx    Stomach cancer Neg Hx    Pancreatic cancer Neg Hx    Liver disease Neg Hx    Inflammatory bowel disease Neg Hx    Rectal cancer Neg Hx    I have reviewed her medical, social, and family history in detail and updated the electronic medical record as necessary.    PHYSICAL EXAMINATION  BP 122/68   Pulse 80   Ht '5\' 1"'$  (1.549 m)   Wt 99 lb 9.6 oz (45.2 kg)   LMP  (LMP Unknown)   SpO2 98%   BMI 18.82 kg/m  Wt Readings from Last 3 Encounters:  02/16/22 99 lb 9.6 oz (45.2 kg)  08/16/21 105 lb (47.6 kg)  06/09/21 102  lb (46.3 kg)  GEN: NAD, appears stated age, doesn't appear chronically ill PSYCH: Cooperative, without pressured speech EYE: Conjunctivae pink, sclerae anicteric ENT: MMM CV: Nontachycardic RESP: No audible wheezing GI: NABS, soft, NT/ND, without rebound or guarding MSK/EXT: No lower extremity edema SKIN: No jaundice NEURO:  Alert & Oriented x 3, no focal deficits   REVIEW OF DATA  I reviewed the following data at the time of this encounter:  GI Procedures and Studies  January 2023 colonoscopy - Hemorrhoids found on digital rectal exam. - The examined portion of the ileum was normal. - Post mucosectomy scar in the cecum. Snare piecemeal resection and forcep avulsion techniques used. Clips (MR conditional) were placed. - One 3 mm polyp in the ascending colon, removed with a cold snare. Resected and retrieved. - Normal mucosa in the entire examined colon otherwise. - Non-bleeding non-thrombosed external and internal  hemorrhoids.  Pathology FINAL MICROSCOPIC DIAGNOSIS:  A. COLON, CECAL POLYP, POLYPECTOMY:  - Tubular adenoma with focal high-grade dysplasia.  - Negative for invasive carcinoma.  B. COLON, ASCENDING POLYP, POLYPECTOMY:  - Sessile serrated polyp.  - Negative for dysplasia and malignancy.   Laboratory Studies  Reviewed those in epic  Imaging Studies  No relevant studies to review   ASSESSMENT  Ms. Genson is a 76 y.o. female with a pmh significant for hyperlipidemia, osteopenia/osteoporosis, chronic constipation, cecal colon adenoma with HGD (status post 3 resection attempts).  The patient is seen today for evaluation and management of:  1. Cecal polyp   2. High grade dysplasia in colonic adenoma   3. Chronic constipation    The patient is clinically and hemodynamically stable.  Unfortunately, this particular polyp has been quite difficult in attempt of resecting it.  Although we made tremendous progress from what this began, the recurrent nature onto follow-up colonoscopies worries me about the overall aggressiveness of this particular lesion.  Surgical resection is becoming a higher potential need for this individual.  After an extensive discussion, we have decided to look at the lesion 1 last time.  I did think at the last colonoscopy, that if there was only a small recurrence that Endorotor or FTRD could be possible, but my hope is that nothing recurs.  Time will tell.  She does understand the potential role of surgical referral but would like to look at the polyp site first before finalizing that decision.  The risks and benefits of endoscopic evaluation were discussed with the patient; these include but are not limited to the risk of perforation, infection, bleeding, missed lesions, lack of diagnosis, severe illness requiring hospitalization, as well as anesthesia and sedation related illnesses.  The patient and/or family is agreeable to proceed.  All patient questions were answered to  the best of my ability, and the patient agrees to the aforementioned plan of action with follow-up as indicated.   PLAN  Proceed with scheduling colonoscopy with EMR follow-up 90-minute case with FTRD/Endorotor Continue FiberCon once to twice daily Preprocedure labs to be obtained as outlined below   Orders Placed This Encounter  Procedures   CBC   Basic Metabolic Panel (BMET)   Protime-INR     New Prescriptions   No medications on file   Modified Medications   No medications on file    Planned Follow Up No follow-ups on file.   Total Time in Face-to-Face and in Coordination of Care for patient including independent/personal interpretation/review of prior testing, medical history, examination, medication adjustment, communicating results with the patient directly, and  documentation with the EHR is 30 minutes.   [name], MD [facility] Office # [phone]

## 2022-02-16 NOTE — Patient Instructions (Signed)
Your provider has requested that you go to the basement level for lab work before leaving today. Press "B" on the elevator. The lab is located at the first door on the left as you exit the elevator.  We have given you several date options for your procedure to be scheduled in September or October. Please call our office back to schedule your procedure after you speak to your family. You will be have the same prep for this procedure.   The [facility] providers would like to encourage you to use [facility] to communicate with providers for non-urgent requests or questions.  Due to long hold times on the telephone, sending your provider a message by [facility] may be a faster and more efficient way to get a response.  Please allow 48 business hours for a response.  Please remember that this is for non-urgent requests.   Due to recent changes in healthcare laws, you may see the results of your imaging and laboratory studies on MyChart before your provider has had a chance to review them.  We understand that in some cases there may be results that are confusing or concerning to you. Not all laboratory results come back in the same time frame and the provider may be waiting for multiple results in order to interpret others.  Please give [place] 48 hours in order for your provider to thoroughly review all the results before contacting the office for clarification of your results.

## 2022-03-03 ENCOUNTER — Telehealth: Payer: Self-pay | Admitting: Gastroenterology

## 2022-03-03 ENCOUNTER — Other Ambulatory Visit: Payer: Self-pay

## 2022-03-03 DIAGNOSIS — K635 Polyp of colon: Secondary | ICD-10-CM

## 2022-03-03 MED ORDER — PLENVU 140 G PO SOLR: 1.0000 | ORAL | 0 refills | Status: DC

## 2022-03-03 NOTE — Telephone Encounter (Signed)
PT is calling to schedule recall colonoscopy at the hospital. Wants it to be on 04/11/2022. Please reach out to advise.

## 2022-03-03 NOTE — Telephone Encounter (Signed)
Proceed with scheduling colonoscopy with EMR follow-up 90-minute case with FTRD/Endorotor   Colon EMR scheduled for 9/11 at 115 pm at [facility]  The pt has been advised and instructed.  All information has been mailed to the pt home.  Prep has been sent to the pharmacy.

## 2022-04-01 ENCOUNTER — Encounter (HOSPITAL_COMMUNITY): Payer: Self-pay | Admitting: Gastroenterology

## 2022-04-01 NOTE — Progress Notes (Signed)
Attempted to obtain medical history via telephone, unable to reach at this time. HIPAA compliant voicemail message left requesting return call to pre surgical testing department. 

## 2022-04-07 ENCOUNTER — Other Ambulatory Visit: Payer: Self-pay | Admitting: Family Medicine

## 2022-04-07 DIAGNOSIS — Z1231 Encounter for screening mammogram for malignant neoplasm of breast: Secondary | ICD-10-CM

## 2022-04-11 ENCOUNTER — Ambulatory Visit (HOSPITAL_BASED_OUTPATIENT_CLINIC_OR_DEPARTMENT_OTHER): Payer: No Typology Code available for payment source | Admitting: Anesthesiology

## 2022-04-11 ENCOUNTER — Encounter (HOSPITAL_COMMUNITY)
Admission: RE | Disposition: A | Discharge status: Home / Self Care | Payer: Self-pay | Source: Ambulatory Visit | Attending: Gastroenterology

## 2022-04-11 ENCOUNTER — Ambulatory Visit (HOSPITAL_COMMUNITY)
Admission: RE | Admit: 2022-04-11 | Discharge: 2022-04-11 | Disposition: A | Discharge status: Home / Self Care | Payer: No Typology Code available for payment source | Source: Ambulatory Visit | Attending: Gastroenterology | Admitting: Gastroenterology

## 2022-04-11 ENCOUNTER — Ambulatory Visit (HOSPITAL_COMMUNITY): Payer: No Typology Code available for payment source | Admitting: Anesthesiology

## 2022-04-11 ENCOUNTER — Encounter (HOSPITAL_COMMUNITY): Payer: Self-pay | Admitting: Gastroenterology

## 2022-04-11 ENCOUNTER — Other Ambulatory Visit: Payer: Self-pay

## 2022-04-11 DIAGNOSIS — Z86010 Personal history of colonic polyps: Secondary | ICD-10-CM

## 2022-04-11 DIAGNOSIS — M1990 Unspecified osteoarthritis, unspecified site: Secondary | ICD-10-CM | POA: Diagnosis not present

## 2022-04-11 DIAGNOSIS — Z9889 Other specified postprocedural states: Secondary | ICD-10-CM | POA: Insufficient documentation

## 2022-04-11 DIAGNOSIS — K641 Second degree hemorrhoids: Secondary | ICD-10-CM | POA: Insufficient documentation

## 2022-04-11 DIAGNOSIS — D120 Benign neoplasm of cecum: Secondary | ICD-10-CM | POA: Diagnosis not present

## 2022-04-11 DIAGNOSIS — Z09 Encounter for follow-up examination after completed treatment for conditions other than malignant neoplasm: Secondary | ICD-10-CM | POA: Diagnosis not present

## 2022-04-11 HISTORY — PX: HOT HEMOSTASIS: SHX5433

## 2022-04-11 HISTORY — PX: HEMOSTASIS CLIP PLACEMENT: SHX6857

## 2022-04-11 HISTORY — PX: COLONOSCOPY WITH PROPOFOL: SHX5780

## 2022-04-11 HISTORY — PX: ENDOSCOPIC MUCOSAL RESECTION: SHX6839

## 2022-04-11 SURGERY — COLONOSCOPY WITH PROPOFOL
Anesthesia: Monitor Anesthesia Care

## 2022-04-11 MED ORDER — PROPOFOL 500 MG/50ML IV EMUL
INTRAVENOUS | Status: DC | PRN
  Administered 2022-04-11: 150 ug/kg/min via INTRAVENOUS

## 2022-04-11 MED ORDER — LIDOCAINE 2% (20 MG/ML) 5 ML SYRINGE
INTRAMUSCULAR | Status: DC | PRN
  Administered 2022-04-11: 50 mg via INTRAVENOUS

## 2022-04-11 MED ORDER — PROPOFOL 10 MG/ML IV BOLUS
INTRAVENOUS | Status: DC | PRN
  Administered 2022-04-11: 20 mg via INTRAVENOUS
  Administered 2022-04-11: 30 mg via INTRAVENOUS

## 2022-04-11 MED ORDER — LACTATED RINGERS IV SOLN: INTRAVENOUS | Status: DC

## 2022-04-11 MED ORDER — PROPOFOL 1000 MG/100ML IV EMUL
INTRAVENOUS | Status: AC
  Filled 2022-04-11: qty 100

## 2022-04-11 MED ORDER — PHENYLEPHRINE 80 MCG/ML (10ML) SYRINGE FOR IV PUSH (FOR BLOOD PRESSURE SUPPORT)
PREFILLED_SYRINGE | INTRAVENOUS | Status: DC | PRN
  Administered 2022-04-11 (×2): 120 ug via INTRAVENOUS

## 2022-04-11 MED ORDER — STERILE WATER FOR IRRIGATION IR SOLN
Status: DC | PRN
  Administered 2022-04-11: 10 mL

## 2022-04-11 SURGICAL SUPPLY — 22 items

## 2022-04-11 NOTE — Anesthesia Postprocedure Evaluation (Signed)
Anesthesia Post Note  Patient: Ashley Gonzalez  Procedure(s) Performed: COLONOSCOPY WITH PROPOFOL HOT HEMOSTASIS (ARGON PLASMA COAGULATION/BICAP) ENDOSCOPIC MUCOSAL RESECTION     Patient location during evaluation: Endoscopy Anesthesia Type: MAC Level of consciousness: awake and alert Pain management: pain level controlled Vital Signs Assessment: post-procedure vital signs reviewed and stable Respiratory status: spontaneous breathing, nonlabored ventilation, respiratory function stable and patient connected to nasal cannula oxygen Cardiovascular status: blood pressure returned to baseline and stable Postop Assessment: no apparent nausea or vomiting Anesthetic complications: no   No notable events documented.  Last Vitals:  Vitals:   04/11/22 1400 04/11/22 1410  BP: (!) 147/70 135/71  Pulse: (!) 54 (!) 55  Resp: 19 (!) 22  Temp:    SpO2: 100% 100%    Last Pain:  Vitals:   04/11/22 1400  TempSrc:   PainSc: 0-No pain                 Rosealyn Little DANIEL

## 2022-04-11 NOTE — Anesthesia Preprocedure Evaluation (Addendum)
Anesthesia Evaluation  Patient identified by MRN, date of birth, ID band Patient awake    Reviewed: Allergy & Precautions, NPO status , Patient's Chart, lab work & pertinent test results  History of Anesthesia Complications Negative for: history of anesthetic complications  Airway Mallampati: I  TM Distance: >3 FB Neck ROM: Full    Dental  (+) Partial Upper, Partial Lower   Pulmonary neg pulmonary ROS,    Pulmonary exam normal        Cardiovascular negative cardio ROS Normal cardiovascular exam     Neuro/Psych negative neurological ROS  negative psych ROS   GI/Hepatic negative GI ROS, Neg liver ROS, Colon adenoma   Endo/Other  negative endocrine ROS  Renal/GU negative Renal ROS     Musculoskeletal  (+) Arthritis ,   Abdominal   Peds  Hematology negative hematology ROS (+)   Anesthesia Other Findings   Reproductive/Obstetrics                            Anesthesia Physical  Anesthesia Plan  ASA: 2  Anesthesia Plan: MAC   Post-op Pain Management: Minimal or no pain anticipated   Induction:   PONV Risk Score and Plan: 2 and Propofol infusion and Treatment may vary due to age or medical condition  Airway Management Planned: Nasal Cannula and Natural Airway  Additional Equipment: None  Intra-op Plan:   Post-operative Plan:   Informed Consent: I have reviewed the patients History and Physical, chart, labs and discussed the procedure including the risks, benefits and alternatives for the proposed anesthesia with the patient or authorized representative who has indicated his/her understanding and acceptance.     Dental advisory given  Plan Discussed with: CRNA and Anesthesiologist  Anesthesia Plan Comments:        Anesthesia Quick Evaluation

## 2022-04-11 NOTE — Op Note (Signed)
[facility] Patient Name: Ashley Gonzalez Procedure Date: 04/11/2022 MRN: [id number] Attending MD: [name] , MD Date of Birth: 11/22/1945 CSN: [id number] Age: 76 Admit Type: Outpatient Procedure:                Colonoscopy Indications:              Surveillance: Personal history of piecemeal removal                            of adenoma on last colonoscopy 6 months ago Providers:                [name], MD, Jeanella Cara, RN,                            William Dalton, Technician Referring MD:             Audree Camel. Reade Medicines:                Monitored Anesthesia Care Complications:            No immediate complications. Estimated Blood Loss:     Estimated blood loss was minimal. Procedure:                Pre-Anesthesia Assessment:                           - Prior to the procedure, a History and Physical                            was performed, and patient medications and                            allergies were reviewed. The patient's tolerance of                            previous anesthesia was also reviewed. The risks                            and benefits of the procedure and the sedation                            options and risks were discussed with the patient.                            All questions were answered, and informed consent                            was obtained. Prior Anticoagulants: The patient has                            taken no previous anticoagulant or antiplatelet                            agents except for NSAID medication. ASA Grade  Assessment: III - A patient with severe systemic                            disease. After reviewing the risks and benefits,                            the patient was deemed in satisfactory condition to                            undergo the procedure.                           After obtaining informed consent, the colonoscope                             was passed under direct vision. Throughout the                            procedure, the patient's blood pressure, pulse, and                            oxygen saturations were monitored continuously. The                            CF-HQ190L ([id number] Olympus colonoscope was                            introduced through the anus and advanced to the 5                            cm into the ileum. The PCF-HQ190L ([id number]) Olympus                            colonoscope was introduced through the and advanced                            to the. The colonoscopy was technically difficult                            and complex due to significant looping. Successful                            completion of the procedure was aided by changing                            the patient's position, using manual pressure,                            straightening and shortening the scope to obtain                            bowel loop reduction and using scope torsion. The  patient tolerated the procedure. The quality of the                            bowel preparation was adequate. The terminal ileum,                            ileocecal valve, appendiceal orifice, and rectum                            were photographed. Scope In: 12:55:39 PM Scope Out: 1:40:13 PM Scope Withdrawal Time: 0 hours 36 minutes 23 seconds  Total Procedure Duration: 0 hours 44 minutes 34 seconds  Findings:      The digital rectal exam findings include hemorrhoids. Pertinent       negatives include no palpable rectal lesions.      The colon (entire examined portion) revealed moderately excessive       looping.      The terminal ileum and ileocecal valve appeared normal.      A large post mucosectomy scar was found in the cecum (across from the       ICV) and not involving the appendiceal orifice. There was residual       polypoid tissue unfortunately, this is now the 3rd recurrence at this        time. Preparations were made for attempt at further resection. It was       obvious that lifting agents would not likely be helpful typically.       Decision made to purse attempt at underwater resection. All air was       removed and underwater instillation was performed and this allowed some       partial floating/lift of the lesion. Using NBI imaging and White-light       endoscopy chromoscopy technique was done to demarcate the borders of the       lesion. Piecemeal mucosal resection using a snare was performed.       Resection was not felt to be complete. The resected tissue was       retrieved. Fulguration to ablate a substantial remaining portion of the       lesion by forceps avulsion was performed and was only partially       successful. The rest of the lesion was quite tough/scarred from previous       resection attempts. I proceeded with fulguration to ablate the remaining       portion of the lesion by argon plasma (right colon settings) which was       felt to be successful. To prevent bleeding post-intervention, six       hemostatic clips were successfully placed (MR conditional). There was no       bleeding at the end of the procedure.      Normal mucosa was found in the entire colon otherwise.      Non-bleeding non-thrombosed internal hemorrhoids were found during       retroflexion, during perianal exam and during digital exam. The       hemorrhoids were Grade II (internal hemorrhoids that prolapse but reduce       spontaneously). Impression:               - Hemorrhoids found on digital rectal exam.                           -  There was significant looping of the colon.                           - The examined portion of the ileum was normal.                           - Post mucosectomy scar with unfortunate 3rd                            recurrence in the cecum (across from the ICV).                            Underwater mucosal resection was not successful                             completely. Treated forceps avulsion. Treated                            majority of remaining lesion with argon plasma                            coagulation (APC). Clips (MR conditional) were                            placed.                           - Normal mucosa in the entire examined colon                            otherwise.                           - Non-bleeding non-thrombosed internal hemorrhoids. Moderate Sedation:      Not Applicable - Patient had care per Anesthesia. Recommendation:           - The patient will be observed post-procedure,                            until all discharge criteria are met.                           - Discharge patient to home.                           - Patient has a contact number available for                            emergencies. The signs and symptoms of potential                            delayed complications were discussed with the                            patient. Return to normal activities tomorrow.  Written discharge instructions were provided to the                            patient.                           - High fiber diet.                           - Use FiberCon 1-2 tablets PO daily.                           - Try to minimize aspirin, ibuprofen, naproxen, or                            other non-steroidal anti-inflammatory drugs for 1                            week if possible to decrease post-interventional                            bleeding risks.                           - Continue present medications.                           - Await pathology results.                           - At this point, I think, it makes most sense for                            patient to have an opportunity to meet our                            Colorectal surgeons and discuss Cecectomy due to                            this now 3rd recurrence of adenomatous tissue. It                             is possible what we have done today will be enough,                            but I have my worries, that this may not be the end                            of this lesion. We will see what the patient wants                            (as long as no evidence of malignancy is found that  would require surgery evaluation).                           - Repeat colonoscopy.                           - The findings and recommendations were discussed                            with the patient.                           - The findings and recommendations were discussed                            with the designated responsible adult. Procedure Code(s):        --- Professional ---                           [phone], Colonoscopy, flexible; with endoscopic                            mucosal resection Diagnosis Code(s):        --- Professional ---                           K64.1, Second degree hemorrhoids                           Z98.890, Other specified postprocedural states                           Z09, Encounter for follow-up examination after                            completed treatment for conditions other than                            malignant neoplasm                           Z86.010, Personal history of colonic polyps CPT copyright 2019 [employer]. All rights reserved. The codes documented in this report are preliminary and upon coder review may  be revised to meet current compliance requirements. [name], MD 04/11/2022 2:08:35 PM Number of Addenda: 0

## 2022-04-11 NOTE — Transfer of Care (Signed)
Immediate Anesthesia Transfer of Care Note  Patient: Ashley Gonzalez  Procedure(s) Performed: COLONOSCOPY WITH PROPOFOL HOT HEMOSTASIS (ARGON PLASMA COAGULATION/BICAP) ENDOSCOPIC MUCOSAL RESECTION  Patient Location: Endoscopy Unit  Anesthesia Type:MAC  Level of Consciousness: drowsy  Airway & Oxygen Therapy: Patient Spontanous Breathing and Patient connected to face mask  Post-op Assessment: Report given to RN and Post -op Vital signs reviewed and stable  Post vital signs: Reviewed and stable  Last Vitals:  Vitals Value Taken Time  BP    Temp    Pulse    Resp    SpO2      Last Pain:  Vitals:   04/11/22 1226  TempSrc: Temporal  PainSc: 0-No pain         Complications: No notable events documented.

## 2022-04-11 NOTE — H&P (Signed)
GASTROENTEROLOGY PROCEDURE H&P NOTE   Primary Care Physician: Maury Dus, MD  HPI: Ashley Gonzalez is a 76 y.o. female who presents for colonoscopy for follow-up of large cecal polyp post EMR in 2021 with recurrence in 2022 and again in 2023 and so we are going to reevaluate 1 last time.  Past Medical History:  Diagnosis Date   Anemia    Arthritis    Hyperlipidemia    Tubular adenoma of colon    Past Surgical History:  Procedure Laterality Date   COLONOSCOPY  08/2019   COLONOSCOPY WITH PROPOFOL N/A 02/10/2020   Procedure: COLONOSCOPY WITH PROPOFOL;  Surgeon: Rush Landmark Telford Nab., MD;  Location: [place];  Service: Gastroenterology;  Laterality: N/A;   COLONOSCOPY WITH PROPOFOL N/A 11/30/2020   Procedure: COLONOSCOPY WITH PROPOFOL;  Surgeon: Rush Landmark Telford Nab., MD;  Location: [facility];  Service: Gastroenterology;  Laterality: N/A;   COLONOSCOPY WITH PROPOFOL N/A 08/16/2021   Procedure: COLONOSCOPY WITH PROPOFOL;  Surgeon: Rush Landmark Telford Nab., MD;  Location: [facility];  Service: Gastroenterology;  Laterality: N/A;   ENDOSCOPIC MUCOSAL RESECTION N/A 02/10/2020   Procedure: ENDOSCOPIC MUCOSAL RESECTION;  Surgeon: Rush Landmark Telford Nab., MD;  Location: [place];  Service: Gastroenterology;  Laterality: N/A;   ENDOSCOPIC MUCOSAL RESECTION N/A 11/30/2020   Procedure: ENDOSCOPIC MUCOSAL RESECTION;  Surgeon: Rush Landmark Telford Nab., MD;  Location: [facility];  Service: Gastroenterology;  Laterality: N/A;   ENDOSCOPIC MUCOSAL RESECTION N/A 08/16/2021   Procedure: ENDOSCOPIC MUCOSAL RESECTION;  Surgeon: Rush Landmark Telford Nab., MD;  Location: [facility];  Service: Gastroenterology;  Laterality: N/A;   HEMOSTASIS CLIP PLACEMENT  02/10/2020   Procedure: HEMOSTASIS CLIP PLACEMENT;  Surgeon: Irving Copas., MD;  Location: [place];  Service: Gastroenterology;;   HEMOSTASIS CLIP PLACEMENT  11/30/2020   Procedure: HEMOSTASIS CLIP PLACEMENT;  Surgeon:  Irving Copas., MD;  Location: [facility];  Service: Gastroenterology;;   POLYPECTOMY  11/30/2020   Procedure: POLYPECTOMY;  Surgeon: Irving Copas., MD;  Location: [facility];  Service: Gastroenterology;;   POLYPECTOMY  08/16/2021   Procedure: POLYPECTOMY;  Surgeon: Irving Copas., MD;  Location: [facility];  Service: Gastroenterology;;   SUBMUCOSAL LIFTING INJECTION  02/10/2020   Procedure: SUBMUCOSAL LIFTING INJECTION;  Surgeon: Irving Copas., MD;  Location: [place];  Service: Gastroenterology;;   SUBMUCOSAL LIFTING INJECTION  11/30/2020   Procedure: SUBMUCOSAL LIFTING INJECTION;  Surgeon: Irving Copas., MD;  Location: [facility];  Service: Gastroenterology;;   SUBMUCOSAL LIFTING INJECTION  08/16/2021   Procedure: SUBMUCOSAL LIFTING INJECTION;  Surgeon: Irving Copas., MD;  Location: [facility];  Service: Gastroenterology;;   TUBAL LIGATION     WISDOM TOOTH EXTRACTION     Current Facility-Administered Medications  Medication Dose Route Frequency Provider Last Rate Last Admin   lactated ringers infusion   Intravenous Continuous Mansouraty, Telford Nab., MD        Current Facility-Administered Medications:    lactated ringers infusion, , Intravenous, Continuous, Mansouraty, Telford Nab., MD No Known Allergies Family History  Problem Relation Age of Onset   Diabetes Sister    Breast cancer Sister        Age 16   Diabetes Brother    Colon polyps Brother    Diabetes Sister    Diabetes Brother    Breast cancer Sister    Colon cancer Neg Hx    Esophageal cancer Neg Hx    Stomach cancer Neg Hx    Pancreatic cancer Neg Hx    Liver disease Neg Hx  Inflammatory bowel disease Neg Hx    Rectal cancer Neg Hx    Social History   Socioeconomic History   Marital status: Married    Spouse name: Not on file   Number of children: 1   Years of education: Not on file   Highest education level: Not on file  Occupational  History   Not on file  Tobacco Use   Smoking status: Never   Smokeless tobacco: Never  Vaping Use   Vaping Use: Never used  Substance and Sexual Activity   Alcohol use: No   Drug use: Never   Sexual activity: Never    Birth control/protection: Surgical    Comment: Tubal Ligation  Other Topics Concern   Not on file  Social History Narrative   Not on file   Social Determinants of Health   Financial Resource Strain: Not on file  Food Insecurity: Not on file  Transportation Needs: Not on file  Physical Activity: Not on file  Stress: Not on file  Social Connections: Not on file  Intimate Partner Violence: Not on file    Physical Exam: There were no vitals filed for this visit. There is no height or weight on file to calculate BMI. GEN: NAD EYE: Sclerae anicteric ENT: MMM CV: Non-tachycardic GI: Soft, NT/ND NEURO:  Alert & Oriented x 3  Lab Results: No results for input(s): "WBC", "HGB", "HCT", "PLT" in the last 72 hours. BMET No results for input(s): "NA", "K", "CL", "CO2", "GLUCOSE", "BUN", "CREATININE", "CALCIUM" in the last 72 hours. LFT No results for input(s): "PROT", "ALBUMIN", "AST", "ALT", "ALKPHOS", "BILITOT", "BILIDIR", "IBILI" in the last 72 hours. PT/INR No results for input(s): "LABPROT", "INR" in the last 72 hours.   Impression / Plan: This is a 76 y.o.female who presents for colonoscopy for follow-up of large cecal polyp post EMR in 2021 with recurrence in 2022 and again in 2023 and so we are going to reevaluate 1 last time.  The risks and benefits of endoscopic evaluation/treatment were discussed with the patient and/or family; these include but are not limited to the risk of perforation, infection, bleeding, missed lesions, lack of diagnosis, severe illness requiring hospitalization, as well as anesthesia and sedation related illnesses.  The patient's history has been reviewed, patient examined, no change in status, and deemed stable for procedure.  The  patient and/or family is agreeable to proceed.    [name], MD [facility] Office # [phone]

## 2022-04-11 NOTE — Discharge Instructions (Signed)

## 2022-04-12 ENCOUNTER — Encounter (HOSPITAL_COMMUNITY): Payer: Self-pay | Admitting: Gastroenterology

## 2022-04-14 LAB — SURGICAL PATHOLOGY

## 2022-04-15 ENCOUNTER — Encounter: Payer: Self-pay | Admitting: Gastroenterology

## 2022-04-15 ENCOUNTER — Other Ambulatory Visit: Payer: Self-pay

## 2022-04-15 DIAGNOSIS — K639 Disease of intestine, unspecified: Secondary | ICD-10-CM

## 2022-04-15 DIAGNOSIS — K635 Polyp of colon: Secondary | ICD-10-CM

## 2022-04-18 ENCOUNTER — Other Ambulatory Visit: Payer: Self-pay

## 2022-04-18 ENCOUNTER — Encounter (HOSPITAL_COMMUNITY): Payer: Self-pay

## 2022-04-18 ENCOUNTER — Emergency Department (HOSPITAL_COMMUNITY)
Admission: EM | Admit: 2022-04-18 | Discharge: 2022-04-18 | Disposition: A | Discharge status: Home / Self Care | Payer: No Typology Code available for payment source | Attending: Emergency Medicine | Admitting: Emergency Medicine

## 2022-04-18 ENCOUNTER — Emergency Department (HOSPITAL_COMMUNITY): Payer: No Typology Code available for payment source

## 2022-04-18 DIAGNOSIS — M79605 Pain in left leg: Secondary | ICD-10-CM | POA: Diagnosis not present

## 2022-04-18 DIAGNOSIS — M545 Low back pain, unspecified: Secondary | ICD-10-CM | POA: Insufficient documentation

## 2022-04-18 DIAGNOSIS — M25552 Pain in left hip: Secondary | ICD-10-CM | POA: Diagnosis not present

## 2022-04-18 DIAGNOSIS — M65252 Calcific tendinitis, left thigh: Secondary | ICD-10-CM | POA: Diagnosis not present

## 2022-04-18 DIAGNOSIS — M47816 Spondylosis without myelopathy or radiculopathy, lumbar region: Secondary | ICD-10-CM | POA: Diagnosis not present

## 2022-04-18 LAB — CBC WITH DIFFERENTIAL/PLATELET
Abs Immature Granulocytes: 0.08 10*3/uL — ABNORMAL HIGH (ref 0.00–0.07)
Basophils Absolute: 0 10*3/uL (ref 0.0–0.1)
Basophils Relative: 0 %
Eosinophils Absolute: 0 10*3/uL (ref 0.0–0.5)
Eosinophils Relative: 0 %
HCT: 39.8 % (ref 36.0–46.0)
Hemoglobin: 13.2 g/dL (ref 12.0–15.0)
Immature Granulocytes: 1 %
Lymphocytes Relative: 5 %
Lymphs Abs: 0.7 10*3/uL (ref 0.7–4.0)
MCH: 30.9 pg (ref 26.0–34.0)
MCHC: 33.2 g/dL (ref 30.0–36.0)
MCV: 93.2 fL (ref 80.0–100.0)
Monocytes Absolute: 1 10*3/uL (ref 0.1–1.0)
Monocytes Relative: 7 %
Neutro Abs: 12.7 10*3/uL — ABNORMAL HIGH (ref 1.7–7.7)
Neutrophils Relative %: 87 %
Platelets: 291 10*3/uL (ref 150–400)
RBC: 4.27 MIL/uL (ref 3.87–5.11)
RDW: 11.9 % (ref 11.5–15.5)
WBC: 14.5 10*3/uL — ABNORMAL HIGH (ref 4.0–10.5)
nRBC: 0 % (ref 0.0–0.2)

## 2022-04-18 LAB — URINALYSIS, MICROSCOPIC (REFLEX): RBC / HPF: NONE SEEN RBC/hpf (ref 0–5)

## 2022-04-18 LAB — BASIC METABOLIC PANEL
Anion gap: 15 (ref 5–15)
BUN: 8 mg/dL (ref 8–23)
CO2: 19 mmol/L — ABNORMAL LOW (ref 22–32)
Calcium: 9.9 mg/dL (ref 8.9–10.3)
Chloride: 100 mmol/L (ref 98–111)
Creatinine, Ser: 0.53 mg/dL (ref 0.44–1.00)
GFR, Estimated: >60 mL/min (ref >60)
Glucose, Bld: 129 mg/dL — ABNORMAL HIGH (ref 70–99)
Potassium: 3.5 mmol/L (ref 3.5–5.1)
Sodium: 134 mmol/L — ABNORMAL LOW (ref 135–145)

## 2022-04-18 LAB — URINALYSIS, ROUTINE W REFLEX MICROSCOPIC
Bilirubin Urine: NEGATIVE
Glucose, UA: NEGATIVE mg/dL
Ketones, ur: 15 mg/dL — AB
Leukocytes,Ua: NEGATIVE
Nitrite: NEGATIVE
Protein, ur: 30 mg/dL — AB
Specific Gravity, Urine: 1.02 (ref 1.005–1.030)
pH: 6 (ref 5.0–8.0)

## 2022-04-18 MED ORDER — KETOROLAC TROMETHAMINE 30 MG/ML IJ SOLN
15.0000 mg | Freq: Once | INTRAMUSCULAR | Status: AC
  Administered 2022-04-18: 15 mg via INTRAMUSCULAR
  Filled 2022-04-18: qty 1

## 2022-04-18 MED ORDER — ACETAMINOPHEN 500 MG PO TABS
1000.0000 mg | ORAL_TABLET | Freq: Once | ORAL | Status: AC
  Administered 2022-04-18: 1000 mg via ORAL
  Filled 2022-04-18: qty 2

## 2022-04-18 MED ORDER — LIDOCAINE 5 % EX PTCH: 1.0000 | MEDICATED_PATCH | CUTANEOUS | 0 refills | Status: DC

## 2022-04-18 MED ORDER — LIDOCAINE 5 % EX PTCH
1.0000 | MEDICATED_PATCH | Freq: Once | CUTANEOUS | Status: DC
  Administered 2022-04-18: 1 via TRANSDERMAL
  Filled 2022-04-18: qty 1

## 2022-04-18 NOTE — ED Provider Triage Note (Signed)
Emergency Medicine Provider Triage Evaluation Note  Ashley Gonzalez , a 76 y.o. female  was evaluated in triage.  Pt complains of increasing back pain over the past 5 days.  Reports colonoscopy 1 week ago.  States that pain is progressive and radiates into her left leg.  She has associated left leg weakness.  She was having difficulty walking this morning because " my leg wasn't doing what I tell it to".  No abdominal pain or fever.  Review of Systems  Positive: Back pain, leg pain Negative: Headache, fever  Physical Exam  BP 127/66 (BP Location: Right Arm)   Pulse 81   Temp 99 F (37.2 C) (Oral)   Resp 15   Ht 5' (1.524 m)   Wt 44.9 kg   LMP  (LMP Unknown)   SpO2 98%   BMI 19.33 kg/m  Gen:   Awake, no distress   Resp:  Normal effort  MSK:   Moves extremities without difficulty  Other:  Trace weakness with flexion and extension of the left hip, tenderness of the left lumbar paraspinous musculature  Medical Decision Making  Medically screening exam initiated at 11:34 AM.  Appropriate orders placed.  PECOLIA MARANDO was informed that the remainder of the evaluation will be completed by another provider, this initial triage assessment does not replace that evaluation, and the importance of remaining in the ED until their evaluation is complete.     Carlisle Cater, PA-C 04/18/22 1136

## 2022-04-18 NOTE — ED Triage Notes (Signed)
Reports having back pain and left leg pain and having difficulty walking. Patient reports had colonscopy last Monday and has been going down hill since.

## 2022-04-18 NOTE — ED Notes (Signed)
DC instructions reviewed with pt. PT verbalized understanding. Pt DC °

## 2022-04-18 NOTE — ED Provider Notes (Signed)
Care of patient assumed from Dr. Ernesto Rutherford.  This patient presents for 4 days of worsened back pain radiating to left leg following recent colonoscopy.  Multimodal pain control was ordered.  She will require reassessment. Physical Exam  BP 130/60   Pulse 83   Temp 98.5 F (36.9 C) (Oral)   Resp 16   Ht 5' (1.524 m)   Wt 44.9 kg   LMP  (LMP Unknown)   SpO2 100%   BMI 19.33 kg/m   Physical Exam Vitals and nursing note reviewed.  Constitutional:      General: She is not in acute distress.    Appearance: Normal appearance. She is well-developed. She is not ill-appearing, toxic-appearing or diaphoretic.  HENT:     Head: Normocephalic and atraumatic.     Right Ear: External ear normal.     Left Ear: External ear normal.     Nose: Nose normal.  Eyes:     Extraocular Movements: Extraocular movements intact.     Conjunctiva/sclera: Conjunctivae normal.  Cardiovascular:     Rate and Rhythm: Normal rate.  Pulmonary:     Effort: Pulmonary effort is normal. No respiratory distress.  Abdominal:     General: There is no distension.     Palpations: Abdomen is soft.     Tenderness: There is no abdominal tenderness.  Musculoskeletal:        General: No swelling. Normal range of motion.     Cervical back: Normal range of motion and neck supple.     Right lower leg: No edema.     Left lower leg: No edema.  Skin:    General: Skin is warm and dry.  Neurological:     General: No focal deficit present.     Mental Status: She is alert and oriented to person, place, and time.  Psychiatric:        Mood and Affect: Mood normal.        Behavior: Behavior normal.        Thought Content: Thought content normal.        Judgment: Judgment normal.     Procedures  Procedures  ED Course / MDM    Medical Decision Making Risk OTC drugs. Prescription drug management.   On assessment, patient reports that she has had significant improvement in her pain.  She states that she "feels like a new  woman".  She does typically take Aleve for her arthritis although she has not in the past week.  She was advised to continue Aleve in addition to Tylenol and lidocaine patches as needed.  Lidocaine patches were prescribed and patient was discharged in good condition.       Godfrey Pick, MD 04/18/22 [phone]

## 2022-04-18 NOTE — Discharge Instructions (Addendum)
You were seen in the emergency department for your back and hip pain.  It appears that you have calcific tendinitis or bursitis of your left hip which is an inflammation of the tendon on your hip.  You can continue to take Tylenol or Motrin/Aleve as needed for pain as well as use ice or heat.  You can also try lidocaine patches which is a numbing type medicine that can help relax the muscles around the area.  You should follow-up with your primary doctor in the next few days to have your symptoms rechecked and to determine if you need further work-up or evaluation.  You should return to the emergency department if you develop numbness or weakness in your legs or groin, you are unable to control your bowels or your bladder, or if you have any other new or concerning symptoms.

## 2022-04-18 NOTE — ED Provider Notes (Signed)
[facility] EMERGENCY DEPARTMENT Provider Note   CSN: [id number] Arrival date & time: 04/18/22  1117     History  Chief Complaint  Patient presents with   Back Pain   Leg Pain    Ashley Gonzalez is a 76 y.o. female.  Patient is a 76 year old female with no significant past medical history presenting to the emergency department with left leg pain and low back pain.  The patient states that she had a colonoscopy performed on Monday and felt a little sore starting on Tuesday and on Wednesday had increasing pain in her left hip and low back.  She denies any trauma or falls.  She denies any numbness or weakness, saddle anesthesia, loss of bowel or bladder function.  She denies any fevers or chills, dysuria or hematuria, diarrhea or constipation.  She states that she was told she cannot take NSAIDs for 5 days after the colonoscopy and has not taken anything for pain since then.  The history is provided by the patient and a friend.  Back Pain Associated symptoms: leg pain   Leg Pain Associated symptoms: back pain        Home Medications Prior to Admission medications   Medication Sig Start Date End Date Taking? Authorizing Provider  alendronate (FOSAMAX) 70 MG tablet Take 70 mg by mouth every Friday. Take with a full glass of water on an empty stomach.    [provider]  Calcium Carb-Cholecalciferol (CALCIUM+D3) 600-800 MG-UNIT TABS Take 1 tablet by mouth daily.    [provider]  IRON PO Take 65 mcg by mouth daily.     [provider]  Multiple Vitamin (MULTIVITAMIN) tablet Take 1 tablet by mouth daily.    [provider]  naproxen sodium (ALEVE) 220 MG tablet Take 220-440 mg by mouth daily as needed (Pain).    [provider]  polycarbophil (FIBERCON) 625 MG tablet Take 1,250 mg by mouth daily.    [provider]  rosuvastatin (CRESTOR) 20 MG tablet Take 20 mg by mouth daily.     [provider]       Allergies    Patient has no known allergies.    Review of Systems   Review of Systems  Musculoskeletal:  Positive for back pain.    Physical Exam Updated Vital Signs BP 130/60   Pulse 83   Temp 98.5 F (36.9 C) (Oral)   Resp 16   Ht 5' (1.524 m)   Wt 44.9 kg   LMP  (LMP Unknown)   SpO2 100%   BMI 19.33 kg/m  Physical Exam Vitals and nursing note reviewed.  Constitutional:      General: She is not in acute distress.    Appearance: She is not toxic-appearing.     Comments: Uncomfortable appearing laying on her left side  HENT:     Head: Normocephalic and atraumatic.     Nose: Nose normal.     Mouth/Throat:     Mouth: Mucous membranes are moist.     Pharynx: Oropharynx is clear.  Eyes:     Extraocular Movements: Extraocular movements intact.     Conjunctiva/sclera: Conjunctivae normal.  Neck:     Comments: No midline neck tenderness Cardiovascular:     Rate and Rhythm: Normal rate and regular rhythm.     Pulses: Normal pulses.     Heart sounds: Normal heart sounds.  Pulmonary:     Effort: Pulmonary effort is normal.     Breath  sounds: Normal breath sounds.  Abdominal:     General: Abdomen is flat.     Palpations: Abdomen is soft.     Tenderness: There is no abdominal tenderness.  Musculoskeletal:     Cervical back: Normal range of motion and neck supple.     Comments: Lumbar paraspinal muscle tenderness bilaterally, no midline back tenderness, no bony tenderness to bilateral lower extremities Negative straight leg raise bilaterally  Neurological:     Mental Status: She is alert and oriented to person, place, and time.     Sensory: No sensory deficit.     Comments: 5 out of 5 strength in bilateral upper extremities 3 out of 5 strength in bilateral hip flexion secondary to pain 5of 5 strength in bilateral dorsi and plantarflexion  Psychiatric:        Mood and Affect: Mood normal.        Behavior: Behavior normal.     ED Results / Procedures / Treatments    Labs (all labs ordered are listed, but only abnormal results are displayed) Labs Reviewed  CBC WITH DIFFERENTIAL/PLATELET - Abnormal; Notable for the following components:      Result Value   WBC 14.5 (*)    Neutro Abs 12.7 (*)    Abs Immature Granulocytes 0.08 (*)    All other components within normal limits  BASIC METABOLIC PANEL - Abnormal; Notable for the following components:   Sodium 134 (*)    CO2 19 (*)    Glucose, Bld 129 (*)    All other components within normal limits  URINALYSIS, ROUTINE W REFLEX MICROSCOPIC - Abnormal; Notable for the following components:   APPearance HAZY (*)    Hgb urine dipstick SMALL (*)    Ketones, ur 15 (*)    Protein, ur 30 (*)    All other components within normal limits  URINALYSIS, MICROSCOPIC (REFLEX) - Abnormal; Notable for the following components:   Bacteria, UA RARE (*)    All other components within normal limits    EKG None  Radiology DG Lumbar Spine Complete  Result Date: 04/18/2022 CLINICAL DATA:  Low back and left hip pain for a few days. No known injury. EXAM: LUMBAR SPINE - COMPLETE 4+ VIEW COMPARISON:  Radiographs 05/25/2009 FINDINGS: There are 5 lumbar type vertebral bodies. The alignment is near anatomic with a minimal scoliosis. There is multilevel spondylosis with progressive disc space narrowing and facet hypertrophy throughout the lumbar spine. No evidence of acute fracture or pars defect. Right pelvic surgical clips noted. IMPRESSION: Progressive lumbar spondylosis from 2010 without acute osseous abnormality or significant malalignment. Electronically Signed   By: Richardean Sale M.D.   On: 04/18/2022 12:43   DG Hip Unilat W or Wo Pelvis 2-3 Views Left  Result Date: 04/18/2022 CLINICAL DATA:  Low back and left hip pain for a few days. No known injury. EXAM: DG HIP (WITH OR WITHOUT PELVIS) 2-3V LEFT COMPARISON:  Pelvic and right hip radiographs 05/25/2009. FINDINGS: The mineralization and alignment are normal. There is  no evidence of acute fracture or dislocation. No evidence of femoral head osteonecrosis. There are mild degenerative changes of both hips, left greater than right. There also degenerative changes at the symphysis pubis, sacroiliac joints and lower lumbar spine. There is an amorphous calcification adjacent to the left femoral greater trochanter which may reflect hydroxyapatite deposition. Metallic foreign bodies overlie the right iliac bone. IMPRESSION: 1. No acute osseous findings. 2. Degenerative changes as described. 3. Possible hydroxyapatite deposition adjacent to the  left femoral greater trochanter which may indicate calcific tendinitis/bursitis. Electronically Signed   By: Richardean Sale M.D.   On: 04/18/2022 12:41    Procedures Procedures    Medications Ordered in ED Medications  acetaminophen (TYLENOL) tablet 1,000 mg (has no administration in time range)  ketorolac (TORADOL) 30 MG/ML injection 15 mg (has no administration in time range)  lidocaine (LIDODERM) 5 % 1-3 patch (has no administration in time range)    ED Course/ Medical Decision Making/ A&P                           Medical Decision Making This patient presents to the ED with chief complaint(s) of back pain, leg pain with no pertinent past medical history which further complicates the presenting complaint. The complaint involves an extensive differential diagnosis and also carries with it a high risk of complications and morbidity.    The differential diagnosis includes spinal fracture, arthritis, cauda equina, epidural abscess or other spinal cord injury unlikely as patient has no saddle anesthesia, focal neurologic deficits or bowel or bladder incontinence, pyelonephritis and nephrolithiasis less likely as the patient denies any urinary symptoms, sciatica unlikely as she has negative straight leg raise bilaterally with no radiating pain  Additional history obtained: Additional history obtained from friend Records  reviewed GI/colonoscopy records  ED Course and Reassessment: Patient was initially evaluated by provider in triage and had labs and x-rays performed.  Patient's labs is without signs of UTI.  Did show signs of mild dehydration.  The patient states that she has been trying to hydrate back up since her surgery and otherwise denies any symptoms of dehydration.  X-ray shows evidence of mildly worsening spondylosis of her lumbar spine as well as concern for possible calcific tendinitis or bursitis of her left hip which is likely contributing to the patient's pain.  She will be given pain control and reassessed.  Independent labs interpretation:  The following labs were independently interpreted: Mild dehydration  Independent visualization of imaging: - I independently visualized the following imaging with scope of interpretation limited to determining acute life threatening conditions related to emergency care: Hip x-ray, lumbar x-ray, which revealed spondylosis of the lumbar spine, possible calcific tendinitis versus bursitis of the left hip  Consultation: - Consulted or discussed management/test interpretation w/ external professional: N/A   Risk OTC drugs. Prescription drug management.           Final Clinical Impression(s) / ED Diagnoses Final diagnoses:  None    Rx / DC Orders ED Discharge Orders     None         Ottie Glazier, DO 04/18/22 1533

## 2022-04-20 ENCOUNTER — Other Ambulatory Visit (HOSPITAL_COMMUNITY): Payer: Self-pay

## 2022-04-21 DIAGNOSIS — Z0000 Encounter for general adult medical examination without abnormal findings: Secondary | ICD-10-CM | POA: Diagnosis not present

## 2022-04-21 DIAGNOSIS — M810 Age-related osteoporosis without current pathological fracture: Secondary | ICD-10-CM | POA: Diagnosis not present

## 2022-04-21 DIAGNOSIS — R634 Abnormal weight loss: Secondary | ICD-10-CM | POA: Diagnosis not present

## 2022-04-21 DIAGNOSIS — Z23 Encounter for immunization: Secondary | ICD-10-CM | POA: Diagnosis not present

## 2022-04-21 DIAGNOSIS — Z1331 Encounter for screening for depression: Secondary | ICD-10-CM | POA: Diagnosis not present

## 2022-04-21 DIAGNOSIS — E2839 Other primary ovarian failure: Secondary | ICD-10-CM | POA: Diagnosis not present

## 2022-04-21 DIAGNOSIS — E7800 Pure hypercholesterolemia, unspecified: Secondary | ICD-10-CM | POA: Diagnosis not present

## 2022-04-21 DIAGNOSIS — M1991 Primary osteoarthritis, unspecified site: Secondary | ICD-10-CM | POA: Diagnosis not present

## 2022-04-21 DIAGNOSIS — M25552 Pain in left hip: Secondary | ICD-10-CM | POA: Diagnosis not present

## 2022-04-22 ENCOUNTER — Ambulatory Visit (HOSPITAL_COMMUNITY)
Admission: RE | Admit: 2022-04-22 | Discharge: 2022-04-22 | Disposition: A | Discharge status: Home / Self Care | Payer: No Typology Code available for payment source | Source: Ambulatory Visit | Attending: Gastroenterology | Admitting: Gastroenterology

## 2022-04-22 ENCOUNTER — Telehealth: Payer: Self-pay | Admitting: Gastroenterology

## 2022-04-22 DIAGNOSIS — K635 Polyp of colon: Secondary | ICD-10-CM | POA: Diagnosis not present

## 2022-04-22 DIAGNOSIS — D259 Leiomyoma of uterus, unspecified: Secondary | ICD-10-CM | POA: Diagnosis not present

## 2022-04-22 DIAGNOSIS — C189 Malignant neoplasm of colon, unspecified: Secondary | ICD-10-CM | POA: Diagnosis not present

## 2022-04-22 DIAGNOSIS — K7689 Other specified diseases of liver: Secondary | ICD-10-CM | POA: Diagnosis not present

## 2022-04-22 DIAGNOSIS — K639 Disease of intestine, unspecified: Secondary | ICD-10-CM

## 2022-04-22 DIAGNOSIS — K6389 Other specified diseases of intestine: Secondary | ICD-10-CM | POA: Diagnosis not present

## 2022-04-22 MED ORDER — IOHEXOL 300 MG/ML  SOLN
80.0000 mL | Freq: Once | INTRAMUSCULAR | Status: AC | PRN
  Administered 2022-04-22: 80 mL via INTRAVENOUS

## 2022-04-22 NOTE — Telephone Encounter (Signed)
Returned call to patient. I informed patient that she will need to contact her insurance company and figure out what Sports administrator is covered under her plan. I advised that [facility] in the only surgical practice in [place]. Pt will call us back once she has information regarding surgeon. Pt verbalized understanding.

## 2022-04-22 NOTE — Telephone Encounter (Signed)
PT is calling because she was referred to a surgeon to get a remaining polyp removed but has since been advised that the surgeon is not covered by her insurance. Please advise. Thank you.

## 2022-04-26 ENCOUNTER — Telehealth: Payer: Self-pay | Admitting: Gastroenterology

## 2022-04-26 NOTE — Telephone Encounter (Signed)
Patient called, states she called her insurance and they states they need prior authorization contract in order for the patient can have procedure. Please call to advise.

## 2022-04-26 NOTE — Telephone Encounter (Signed)
Is this a prior authorization for a medication? If so what medication?

## 2022-04-27 NOTE — Telephone Encounter (Signed)
I don't think this an issue that requires pharmacy assistance. I forwarded this message to [facility] for further assistance

## 2022-04-27 NOTE — Telephone Encounter (Signed)
This is in regards to a referral to a surgeon.  See 9/22 note.  She will need to find out which surgeon her insurance covers.  A referral was made to [facility] so the pt will need to contact [facility] for any precert needed for surgery.    I have spoken to the pt and made her aware - she says that her insurance needs the surgery office to contact them with a "contract"  I have asked the pt to contact [facility] and see if that is something that they can do. She will call them and let us know if there is anything else we need to do.

## 2022-04-27 NOTE — Telephone Encounter (Signed)
I reached out to patient this morning and she states, her insurance would not cover her procedure unless we send a prior authorization contract to her insurance company so they can make a decision from there. Please call to follow.

## 2022-05-04 ENCOUNTER — Telehealth: Payer: Self-pay | Admitting: Gastroenterology

## 2022-05-04 NOTE — Telephone Encounter (Signed)
Inbound call from patient stating that her PCP is requesting records from her procedure on 9/11. Patient stated they did not know why she needs to have surgery? Patient is requesting records to be sent. Please advise.

## 2022-05-04 NOTE — Telephone Encounter (Signed)
Records have been sent to PCP as requested.  Pt aware

## 2022-05-10 NOTE — Telephone Encounter (Signed)
Patient stated that her PCP has not received records on patiens last colonoscopy report. Patient is requesting it be resent. Please advise.

## 2022-05-10 NOTE — Telephone Encounter (Signed)
All records have been resent to the PCP- pt advised

## 2022-05-13 ENCOUNTER — Ambulatory Visit
Admission: RE | Admit: 2022-05-13 | Discharge: 2022-05-13 | Disposition: A | Discharge status: Home / Self Care | Payer: No Typology Code available for payment source | Source: Ambulatory Visit | Attending: Family Medicine | Admitting: Family Medicine

## 2022-05-13 DIAGNOSIS — E2839 Other primary ovarian failure: Secondary | ICD-10-CM

## 2022-05-13 DIAGNOSIS — Z1231 Encounter for screening mammogram for malignant neoplasm of breast: Secondary | ICD-10-CM

## 2022-05-13 DIAGNOSIS — Z780 Asymptomatic menopausal state: Secondary | ICD-10-CM | POA: Diagnosis not present

## 2022-05-23 DIAGNOSIS — E785 Hyperlipidemia, unspecified: Secondary | ICD-10-CM | POA: Diagnosis not present

## 2022-05-23 DIAGNOSIS — Z681 Body mass index (BMI) 19 or less, adult: Secondary | ICD-10-CM | POA: Diagnosis not present

## 2022-05-23 DIAGNOSIS — M810 Age-related osteoporosis without current pathological fracture: Secondary | ICD-10-CM | POA: Diagnosis not present

## 2022-05-23 DIAGNOSIS — Z008 Encounter for other general examination: Secondary | ICD-10-CM | POA: Diagnosis not present

## 2022-05-26 DIAGNOSIS — M150 Primary generalized (osteo)arthritis: Secondary | ICD-10-CM | POA: Diagnosis not present

## 2022-05-26 NOTE — Telephone Encounter (Signed)
Records have been faxed a fifth time to Ashley Gonzalez [phone] Tried to call the office and was on hold for 15 min.

## 2022-05-26 NOTE — Telephone Encounter (Signed)
[place] [email or web address] [address] [place], [place] [address]  ~3.8 mi [phone]

## 2022-05-26 NOTE — Telephone Encounter (Signed)
Ashley Billow do you have a phone number I need to call?  I have sent these records 4 times

## 2022-05-26 NOTE — Telephone Encounter (Signed)
PCP called requesting last office to be sent to them so they can start PA for colorectal procedure she was recommended to have. Please fax it to attn Marilynne Drivers

## 2022-06-01 DIAGNOSIS — D649 Anemia, unspecified: Secondary | ICD-10-CM | POA: Diagnosis not present

## 2022-06-29 ENCOUNTER — Telehealth: Payer: Self-pay | Admitting: Gastroenterology

## 2022-06-29 NOTE — Telephone Encounter (Signed)
The pt says that she spoke with her insurance company and [facility] is covered.  She has been advised to call [facility] and schedule the appt as able.  The referral has been faxed multiple times.  She will need to discuss with [facility] the insurance questions.  The pt has been advised of the information and verbalized understanding.

## 2022-06-29 NOTE — Telephone Encounter (Signed)
I called [facility] to make sure the pt was able to set up the appt.  I spoke with CJ at [facility] [place] and he tells me that whoever told her that they do not accept her insurance was incorrect.  He says that if we put in a new referral she can set up the appt. I have re-entered the referral and faxed records.  I have called the pt and made her aware that she should be able to call and make appt and she should reach back out if she continues to have issues.

## 2022-06-29 NOTE — Telephone Encounter (Signed)
Patient is calling in regards to a surgery she is having and is wishing to speak with you. Please advise

## 2022-06-29 NOTE — Telephone Encounter (Signed)
PT is still trying to have colorectal surgery. [facility] was the only place available and they are looking for another place to do surgery. Please advise. Thank you

## 2022-08-08 ENCOUNTER — Ambulatory Visit: Payer: Self-pay | Admitting: General Surgery

## 2022-08-08 DIAGNOSIS — D122 Benign neoplasm of ascending colon: Secondary | ICD-10-CM | POA: Diagnosis not present

## 2022-08-08 NOTE — H&P (Signed)
REFERRING PHYSICIAN:  Mansouraty, Telford Nab.*   PROVIDER:  Monico Blitz, MD   MRN: [id number] DOB: 09-01-1945 DATE OF ENCOUNTER: 08/08/2022   Subjective    Chief Complaint: No chief complaint on file.       History of Present Illness: Ashley Gonzalez is a 77 y.o. female who is seen today as an office consultation at the request of Dr. Rush Landmark for evaluation of No chief complaint on file. .  77 year old female who has been undergoing endoscopic resections of a cecal polyp since 2021.  She has had 3 attempts to remove this polyp, but they have been unsuccessful.  She is here today to discuss surgery.  Most recent pathology shows traditional serrated adenoma with high-grade dysplasia.  Patient has no major medical problems.  She has never had any abdominal surgeries.  She has had some significant weight loss recently.  She feels that she was not eating enough and exercising too much.  She is working on increasing her diet to help with this.       Review of Systems: A complete review of systems was obtained from the patient.  I have reviewed this information and discussed as appropriate with the patient.  See HPI as well for other ROS.     Medical History: Past Medical History      Past Medical History:  Diagnosis Date   Arthritis     Hyperlipidemia          There is no problem list on file for this patient.     Past Surgical History  History reviewed. No pertinent surgical history.      Allergies  No Known Allergies           Current Outpatient Medications on File Prior to Visit  Medication Sig Dispense Refill   alendronate (FOSAMAX) 70 MG tablet Take by mouth       calcium carbonate-vitamin D3 600 mg-20 mcg (800 unit) Tab Take 1 tablet by mouth once daily       ferrous sulfate (IRON) 325 (65 FE) MG tablet Take by mouth       multivitamin tablet Take 1 tablet by mouth once daily       rosuvastatin (CRESTOR) 20 MG tablet Take 20 mg by mouth once daily         No current facility-administered medications on file prior to visit.      Family History       Family History  Problem Relation Age of Onset   Diabetes Sister     Breast cancer Sister     Diabetes Brother          Social History       Tobacco Use  Smoking Status Never  Smokeless Tobacco Never      Social History  Social History        Socioeconomic History   Marital status: Single  Tobacco Use   Smoking status: Never   Smokeless tobacco: Never  Vaping Use   Vaping Use: Never used  Substance and Sexual Activity   Alcohol use: Never   Drug use: Never        Objective:         Vitals:    08/08/22 0917  BP: 138/72  Pulse: 76  Temp: 36.6 C (97.9 F)  SpO2: 93%  Weight: 47.1 kg (103 lb 12.8 oz)  Height: 154.9 cm ('5\' 1"'$ )      Exam Gen: NAD Abd: soft  Labs, Imaging and Diagnostic Testing: Colonoscopy reports and pathology reports reviewed.   Assessment and Plan:  Diagnoses and all orders for this visit:   Adenomatous polyp of ascending colon       77 year old female female with an endoscopically unresectable cecal polyp across from the ileocecal valve.  There is high-grade dysplasia noted in the pathology report.  We discussed that there is a possibility that there is an underlying cancer in her colon.  We discussed performing a cancer operation including lymph node resection.  We discussed the details of a right hemicolectomy in risk associated with this.  All questions were answered.   The surgery and anatomy were described to the patient as well as the risks of surgery and the possible complications.  These include: Bleeding, deep abdominal infections and possible wound complications such as hernia and infection, damage to adjacent structures, leak of surgical connections, which can lead to other surgeries and possibly an ostomy, possible need for other procedures, such as abscess drains in radiology, possible prolonged hospital stay,  possible diarrhea from removal of part of the colon, possible constipation from narcotics, possible bowel, bladder or sexual dysfunction if having rectal surgery, prolonged fatigue/weakness or appetite loss, possible early recurrence of of disease, possible complications of their medical problems such as heart disease or arrhythmias or lung problems, death (less than 1%). I believe the patient understands and wishes to proceed with the surgery.    No follow-ups on file.    Ashley Adie, MD Colon and Rectal Surgery [facility]

## 2022-10-12 NOTE — Progress Notes (Signed)
COVID Vaccine received:  '[]'$  No '[x]'$  Yes Date of any COVID positive Test in last 90 days:  PCP - Maury Dus, MD Cardiologist - none  Chest x-ray -  EKG -  no pertinent hix Stress Test -  ECHO -  Cardiac Cath -   PCR screen: '[]'$  Ordered & Completed                      '[x]'$   No Order but Needs PROFEND                      '[]'$   N/A for this surgery  Surgery Plan:  '[]'$  Ambulatory                            '[]'$  Outpatient in bed                            '[x]'$  Admit  Anesthesia:    '[x]'$  General  '[]'$  Spinal                           '[]'$   Choice '[]'$   MAC  Bowel Prep - '[]'$  No  '[x]'$   Yes _Flagyl, neomycin, Miralax 7 Dulcolax _____  Pacemaker / ICD device '[x]'$  No '[]'$  Yes        Device order form faxed '[x]'$  No    '[]'$   Yes      Faxed to:  Spinal Cord Stimulator:'[x]'$  No '[]'$  Yes      (Remind patient to bring remote DOS) Other Implants:   History of Sleep Apnea? '[x]'$  No '[]'$  Yes   CPAP used?- '[x]'$  No '[]'$  Yes    Does the patient monitor blood sugar? '[]'$  No '[]'$  Yes  '[x]'$  N/A  Blood Thinner / Instructions: none Aspirin Instructions:  none  ERAS Protocol Ordered: '[]'$  No  '[x]'$  Yes PRE-SURGERY '[x]'$  ENSURE X3 Patient is to be NPO after: 04:30 am  Activity level: Patient is able to climb a flight of stairs without difficulty; '[x]'$  No CP  '[x]'$  No SOB  Patient can perform ADLs without assistance.   Anesthesia review: anemia, dysplastic colon polyp ?cancerous, no other pertinent history  Patient denies shortness of breath, fever, cough and chest pain at PAT appointment.  Patient verbalized understanding and agreement to the Pre-Surgical Instructions that were given to them at this PAT appointment. Patient was also educated of the need to review these PAT instructions again prior to her surgery.I reviewed the appropriate phone numbers to call if they have any and questions or concerns.

## 2022-10-12 NOTE — Patient Instructions (Signed)
SURGICAL WAITING ROOM VISITATION Patients having surgery or a procedure may have no more than 2 support people in the waiting area - these visitors may rotate in the visitor waiting room.   Due to an increase in RSV and influenza rates and associated hospitalizations, children ages 69 and under may not visit patients in [place]. If the patient needs to stay at the hospital during part of their recovery, the visitor guidelines for inpatient rooms apply.  PRE-OP VISITATION  Pre-op nurse will coordinate an appropriate time for 1 support person to accompany the patient in pre-op.  This support person may not rotate.  This visitor will be contacted when the time is appropriate for the visitor to come back in the pre-op area.  Please refer to the [facility] website for the visitor guidelines for Inpatients (after your surgery is over and you are in a regular room).  You are not required to quarantine at this time prior to your surgery. However, you must do this: Hand Hygiene often Do NOT share personal items Notify your provider if you are in close contact with someone who has COVID or you develop fever 100.4 or greater, new onset of sneezing, cough, sore throat, shortness of breath or body aches.  If you test positive for Covid or have been in contact with anyone that has tested positive in the last 10 days please notify you surgeon.    Your procedure is scheduled on:  Wednesday   October 26, 2022  Report to [facility] Main Entrance: [place] entrance where the [employer] is available.   Report to admitting at:  05:15 AM  +++++Call this number if you have any questions or problems the morning of surgery [phone]  Do not eat food after Midnight the night prior to your surgery/procedure.  After Midnight you may have the following liquids until   04:30 AM DAY OF SURGERY  Clear Liquid Diet Water Black Coffee (sugar ok, NO MILK/CREAM OR CREAMERS)  Tea (sugar ok, NO  MILK/CREAM OR CREAMERS) regular and decaf                             Plain Jell-O  with no fruit (NO RED)                                           Fruit ices (not with fruit pulp, NO RED)                                     Popsicles (NO RED)                                                                  Juice: apple, WHITE grape, WHITE cranberry Sports drinks like Gatorade or Powerade (NO RED)             DRINK two (2) bottles of Pre-Surgery Clear Ensure starting at 6:00 pm the evening prior to your surgery to help prevent dehydration. Increase drinking clear fluids (see list above)  The day of surgery:  Drink ONE (1) Pre-Surgery Clear Ensure at  04:30 AM the morning of surgery. Drink in one sitting. Do not sip.  This drink was given to you during your hospital pre-op appointment visit. Nothing else to drink after completing the Pre-Surgery Clear Ensure : No candy, chewing gum or throat lozenges.    FOLLOW BOWEL PREP AND ANY ADDITIONAL PRE OP INSTRUCTIONS YOU RECEIVED FROM YOUR SURGEON'S OFFICE!!!  Dulcolax 20  mg - Take four ('5mg'$ ) tablets with water at 07:00 am the day prior to surgery.  Miralax 255 g - Mix with 64 oz Gatorade/Powerade.  Starting at 10:00 am ,Drink this gradually over the next few hours (8 oz glass every 15-30 minutes) until gone the day prior to surgery You should finish in 4 hours-6 hours.    Neomycin 1000 mg - At 2 pm, 3 pm and 10 pm after Miralax  bowel prep the day prior to surgery.  Metronidazole 1000 mg - At 2 pm, 3 pm and 10 pm after Miralax bowel prep the day prior to surgery.   Drink plenty of clear liquids all evening to avoid getting dehydrated.    Oral Hygiene is also important to reduce your risk of infection.        Remember - BRUSH YOUR TEETH THE MORNING OF SURGERY WITH YOUR REGULAR TOOTHPASTE  Do NOT smoke after Midnight the night before surgery.  Take ONLY these medicines the morning of surgery with A SIP OF WATER:  none   You may not have any metal on your body including hair pins, jewelry, and body piercing  Do not wear make-up, lotions, powders, perfumes or deodorant  Do not wear nail polish including gel and S&S, artificial / acrylic nails, or any other type of covering on natural nails including finger and toenails. If you have artificial nails, gel coating, etc., that needs to be removed by a nail salon, Please have this removed prior to surgery. Not doing so may mean that your surgery could be cancelled or delayed if the Surgeon or anesthesia staff feels like they are unable to monitor you safely.   Do not shave 48 hours prior to surgery to avoid nicks in your skin which may contribute to postoperative infections.   Contacts, Hearing Aids, dentures or bridgework may not be worn into surgery. DENTURES WILL BE REMOVED PRIOR TO SURGERY PLEASE DO NOT APPLY "Poly grip" OR ADHESIVES!!!  You may bring a small overnight bag with you on the day of surgery, only pack items that are not valuable. [facility] IS NOT RESPONSIBLE   FOR VALUABLES THAT ARE LOST OR STOLEN.   Do not bring your home medications to the hospital. The Pharmacy will dispense medications listed on your medication list to you during your admission in the Hospital.  Special Instructions: Bring a copy of your healthcare power of attorney and living will documents the day of surgery, if you wish to have them scanned into your [facility] Records- EPIC  Please read over the following fact sheets you were given: IF YOU HAVE QUESTIONS ABOUT YOUR [place], [place] [phone].   [place] [facility] - Preparing for Surgery Before surgery, you can play an important role.  Because skin is not sterile, your skin needs to be as free of germs as possible.  You can reduce the number of germs on your skin by washing with CHG (chlorahexidine gluconate) soap before surgery.  CHG is an antiseptic cleaner which kills germs and bonds  with the  skin to continue killing germs even after washing. Please DO NOT use if you have an allergy to CHG or antibacterial soaps.  If your skin becomes reddened/irritated stop using the CHG and inform your nurse when you arrive at Short Stay. Do not shave (including legs and underarms) for at least 48 hours prior to the first CHG shower.  You may shave your face/neck.  Please follow these instructions carefully:  1.  Shower with CHG Soap the night before surgery and the  morning of surgery.  2.  If you choose to wash your hair, wash your hair first as usual with your normal  shampoo.  3.  After you shampoo, rinse your hair and body thoroughly to remove the shampoo.                             4.  Use CHG as you would any other liquid soap.  You can apply chg directly to the skin and wash.  Gently with a scrungie or clean washcloth.  5.  Apply the CHG Soap to your body ONLY FROM THE NECK DOWN.   Do not use on face/ open                           Wound or open sores. Avoid contact with eyes, ears mouth and genitals (private parts).                       Wash face,  Genitals (private parts) with your normal soap.             6.  Wash thoroughly, paying special attention to the area where your  surgery  will be performed.  7.  Thoroughly rinse your body with warm water from the neck down.  8.  DO NOT shower/wash with your normal soap after using and rinsing off the CHG Soap.            9.  Pat yourself dry with a clean towel.            10.  Wear clean pajamas.            11.  Place clean sheets on your bed the night of your first shower and do not  sleep with pets.  ON THE DAY OF SURGERY : Do not apply any lotions/deodorants the morning of surgery.  Please wear clean clothes to the hospital/surgery center.    FAILURE TO FOLLOW THESE INSTRUCTIONS MAY RESULT IN THE CANCELLATION OF YOUR SURGERY  PATIENT SIGNATURE_________________________________  NURSE  SIGNATURE__________________________________  ________________________________________________________________________        Adam Phenix    An incentive spirometer is a tool that can help keep your lungs clear and active. This tool measures how well you are filling your lungs with each breath. Taking long deep breaths may help reverse or decrease the chance of developing breathing (pulmonary) problems (especially infection) following: A long period of time when you are unable to move or be active. BEFORE THE PROCEDURE  If the spirometer includes an indicator to show your best effort, your nurse or respiratory therapist will set it to a desired goal. If possible, sit up straight or lean slightly forward. Try not to slouch. Hold the incentive spirometer in an upright position. INSTRUCTIONS FOR USE  Sit on the edge of your bed if possible, or sit up as  far as you can in bed or on a chair. Hold the incentive spirometer in an upright position. Breathe out normally. Place the mouthpiece in your mouth and seal your lips tightly around it. Breathe in slowly and as deeply as possible, raising the piston or the ball toward the top of the column. Hold your breath for 3-5 seconds or for as long as possible. Allow the piston or ball to fall to the bottom of the column. Remove the mouthpiece from your mouth and breathe out normally. Rest for a few seconds and repeat Steps 1 through 7 at least 10 times every 1-2 hours when you are awake. Take your time and take a few normal breaths between deep breaths. The spirometer may include an indicator to show your best effort. Use the indicator as a goal to work toward during each repetition. After each set of 10 deep breaths, practice coughing to be sure your lungs are clear. If you have an incision (the cut made at the time of surgery), support your incision when coughing by placing a pillow or rolled up towels firmly against it. Once you are able  to get out of bed, walk around indoors and cough well. You may stop using the incentive spirometer when instructed by your caregiver.  RISKS AND COMPLICATIONS Take your time so you do not get dizzy or light-headed. If you are in pain, you may need to take or ask for pain medication before doing incentive spirometry. It is harder to take a deep breath if you are having pain. AFTER USE Rest and breathe slowly and easily. It can be helpful to keep track of a log of your progress. Your caregiver can provide you with a simple table to help with this. If you are using the spirometer at home, follow these instructions: [place] IF:  You are having difficultly using the spirometer. You have trouble using the spirometer as often as instructed. Your pain medication is not giving enough relief while using the spirometer. You develop fever of 100.5 F (38.1 C) or higher.                                                                                                    SEEK IMMEDIATE MEDICAL CARE IF:  You cough up bloody sputum that had not been present before. You develop fever of 102 F (38.9 C) or greater. You develop worsening pain at or near the incision site. MAKE SURE YOU:  Understand these instructions. Will watch your condition. Will get help right away if you are not doing well or get worse. Document Released: 11/28/2006 Document Revised: 10/10/2011 Document Reviewed: 01/29/2007 [facility] Patient Information 2014 [place], [place].

## 2022-10-13 ENCOUNTER — Encounter (HOSPITAL_COMMUNITY): Payer: Self-pay

## 2022-10-13 ENCOUNTER — Encounter (HOSPITAL_COMMUNITY)
Admission: RE | Admit: 2022-10-13 | Discharge: 2022-10-13 | Disposition: A | Discharge status: Home / Self Care | Payer: No Typology Code available for payment source | Source: Ambulatory Visit | Attending: General Surgery | Admitting: General Surgery

## 2022-10-13 ENCOUNTER — Other Ambulatory Visit: Payer: Self-pay

## 2022-10-13 VITALS — BP 124/66 | HR 70 | Temp 98.0°F | Resp 14 | Ht 61.0 in | Wt 104.0 lb

## 2022-10-13 DIAGNOSIS — Z01812 Encounter for preprocedural laboratory examination: Secondary | ICD-10-CM | POA: Diagnosis not present

## 2022-10-13 DIAGNOSIS — D126 Benign neoplasm of colon, unspecified: Secondary | ICD-10-CM

## 2022-10-13 DIAGNOSIS — Z01818 Encounter for other preprocedural examination: Secondary | ICD-10-CM

## 2022-10-13 LAB — CBC
HCT: 39.8 % (ref 36.0–46.0)
Hemoglobin: 12.6 g/dL (ref 12.0–15.0)
MCH: 28.5 pg (ref 26.0–34.0)
MCHC: 31.7 g/dL (ref 30.0–36.0)
MCV: 90 fL (ref 80.0–100.0)
Platelets: 396 10*3/uL (ref 150–400)
RBC: 4.42 MIL/uL (ref 3.87–5.11)
RDW: 13.2 % (ref 11.5–15.5)
WBC: 7.5 10*3/uL (ref 4.0–10.5)
nRBC: 0 % (ref 0.0–0.2)

## 2022-10-13 LAB — BASIC METABOLIC PANEL
Anion gap: 13 (ref 5–15)
BUN: 12 mg/dL (ref 8–23)
CO2: 23 mmol/L (ref 22–32)
Calcium: 10 mg/dL (ref 8.9–10.3)
Chloride: 103 mmol/L (ref 98–111)
Creatinine, Ser: 0.58 mg/dL (ref 0.44–1.00)
GFR, Estimated: >60 mL/min (ref >60)
Glucose, Bld: 97 mg/dL (ref 70–99)
Potassium: 4 mmol/L (ref 3.5–5.1)
Sodium: 139 mmol/L (ref 135–145)

## 2022-10-25 NOTE — Anesthesia Preprocedure Evaluation (Signed)
Anesthesia Evaluation  Patient identified by MRN, date of birth, ID band Patient awake    Reviewed: Allergy & Precautions, H&P , NPO status , Patient's Chart, lab work & pertinent test results  Airway Mallampati: II  TM Distance: >3 FB Neck ROM: Full    Dental no notable dental hx. (+) Poor Dentition, Missing, Dental Advisory Given   Pulmonary neg pulmonary ROS   Pulmonary exam normal breath sounds clear to auscultation       Cardiovascular Exercise Tolerance: Good negative cardio ROS Normal cardiovascular exam Rhythm:Regular Rate:Normal     Neuro/Psych negative neurological ROS  negative psych ROS   GI/Hepatic negative GI ROS, Neg liver ROS,,,  Endo/Other  negative endocrine ROS    Renal/GU negative Renal ROS  negative genitourinary   Musculoskeletal negative musculoskeletal ROS (+) Arthritis ,    Abdominal   Peds negative pediatric ROS (+)  Hematology negative hematology ROS (+) Blood dyscrasia, anemia   Anesthesia Other Findings   Reproductive/Obstetrics negative OB ROS                             Anesthesia Physical Anesthesia Plan  ASA: 3  Anesthesia Plan: General   Post-op Pain Management: Tylenol PO (pre-op)* and Celebrex PO (pre-op)*   Induction: Intravenous  PONV Risk Score and Plan: 3 and Ondansetron, Dexamethasone and Treatment may vary due to age or medical condition  Airway Management Planned: Oral ETT  Additional Equipment: None  Intra-op Plan:   Post-operative Plan: Extubation in OR  Informed Consent: I have reviewed the patients History and Physical, chart, labs and discussed the procedure including the risks, benefits and alternatives for the proposed anesthesia with the patient or authorized representative who has indicated his/her understanding and acceptance.     Dental advisory given  Plan Discussed with: Anesthesiologist and CRNA  Anesthesia Plan  Comments: (  )       Anesthesia Quick Evaluation

## 2022-10-26 ENCOUNTER — Inpatient Hospital Stay (HOSPITAL_COMMUNITY): Payer: No Typology Code available for payment source | Admitting: Certified Registered"

## 2022-10-26 ENCOUNTER — Encounter (HOSPITAL_COMMUNITY): Payer: Self-pay | Admitting: General Surgery

## 2022-10-26 ENCOUNTER — Other Ambulatory Visit: Payer: Self-pay

## 2022-10-26 ENCOUNTER — Inpatient Hospital Stay (HOSPITAL_COMMUNITY)
Admission: RE | Admit: 2022-10-26 | Discharge: 2022-10-28 | DRG: 330 | Disposition: A | Discharge status: Home / Self Care | Payer: No Typology Code available for payment source | Attending: General Surgery | Admitting: General Surgery

## 2022-10-26 ENCOUNTER — Encounter (HOSPITAL_COMMUNITY)
Admission: RE | Disposition: A | Discharge status: Home / Self Care | Payer: Self-pay | Source: Home / Self Care | Attending: General Surgery

## 2022-10-26 DIAGNOSIS — E785 Hyperlipidemia, unspecified: Secondary | ICD-10-CM | POA: Diagnosis present

## 2022-10-26 DIAGNOSIS — Z79899 Other long term (current) drug therapy: Secondary | ICD-10-CM | POA: Diagnosis not present

## 2022-10-26 DIAGNOSIS — M1990 Unspecified osteoarthritis, unspecified site: Secondary | ICD-10-CM | POA: Diagnosis present

## 2022-10-26 DIAGNOSIS — K635 Polyp of colon: Secondary | ICD-10-CM

## 2022-10-26 DIAGNOSIS — D649 Anemia, unspecified: Secondary | ICD-10-CM

## 2022-10-26 DIAGNOSIS — Z681 Body mass index (BMI) 19 or less, adult: Secondary | ICD-10-CM

## 2022-10-26 DIAGNOSIS — Z7983 Long term (current) use of bisphosphonates: Secondary | ICD-10-CM | POA: Diagnosis not present

## 2022-10-26 DIAGNOSIS — D122 Benign neoplasm of ascending colon: Secondary | ICD-10-CM | POA: Diagnosis present

## 2022-10-26 DIAGNOSIS — R634 Abnormal weight loss: Secondary | ICD-10-CM | POA: Diagnosis present

## 2022-10-26 SURGERY — COLECTOMY, PARTIAL, ROBOT-ASSISTED, LAPAROSCOPIC
Anesthesia: General

## 2022-10-26 MED ORDER — ROCURONIUM BROMIDE 10 MG/ML (PF) SYRINGE
PREFILLED_SYRINGE | INTRAVENOUS | Status: DC | PRN
  Administered 2022-10-26: 50 mg via INTRAVENOUS
  Administered 2022-10-26: 20 mg via INTRAVENOUS

## 2022-10-26 MED ORDER — OXYCODONE HCL 5 MG PO TABS: 5.0000 mg | ORAL_TABLET | Freq: Once | ORAL | Status: DC | PRN

## 2022-10-26 MED ORDER — DEXAMETHASONE SODIUM PHOSPHATE 10 MG/ML IJ SOLN
INTRAMUSCULAR | Status: AC
  Filled 2022-10-26: qty 1

## 2022-10-26 MED ORDER — ONDANSETRON HCL 4 MG/2ML IJ SOLN: 4.0000 mg | Freq: Once | INTRAMUSCULAR | Status: DC | PRN

## 2022-10-26 MED ORDER — GABAPENTIN 300 MG PO CAPS
300.0000 mg | ORAL_CAPSULE | ORAL | Status: AC
  Administered 2022-10-26: 300 mg via ORAL
  Filled 2022-10-26: qty 1

## 2022-10-26 MED ORDER — ROSUVASTATIN CALCIUM 20 MG PO TABS
20.0000 mg | ORAL_TABLET | Freq: Every day | ORAL | Status: DC
  Administered 2022-10-27 – 2022-10-28 (×2): 20 mg via ORAL
  Filled 2022-10-26 (×2): qty 1

## 2022-10-26 MED ORDER — BUPIVACAINE LIPOSOME 1.3 % IJ SUSP
INTRAMUSCULAR | Status: AC
  Filled 2022-10-26: qty 20

## 2022-10-26 MED ORDER — ALUM & MAG HYDROXIDE-SIMETH 200-200-20 MG/5ML PO SUSP: 30.0000 mL | Freq: Four times a day (QID) | ORAL | Status: DC | PRN

## 2022-10-26 MED ORDER — DIPHENHYDRAMINE HCL 50 MG/ML IJ SOLN: 12.5000 mg | Freq: Four times a day (QID) | INTRAMUSCULAR | Status: DC | PRN

## 2022-10-26 MED ORDER — FENTANYL CITRATE (PF) 100 MCG/2ML IJ SOLN
INTRAMUSCULAR | Status: DC | PRN
  Administered 2022-10-26: 50 ug via INTRAVENOUS

## 2022-10-26 MED ORDER — PROPOFOL 10 MG/ML IV BOLUS
INTRAVENOUS | Status: DC | PRN
  Administered 2022-10-26: 100 mg via INTRAVENOUS

## 2022-10-26 MED ORDER — ACETAMINOPHEN 325 MG PO TABS: 325.0000 mg | ORAL_TABLET | ORAL | Status: DC | PRN

## 2022-10-26 MED ORDER — OXYCODONE HCL 5 MG/5ML PO SOLN: 5.0000 mg | Freq: Once | ORAL | Status: DC | PRN

## 2022-10-26 MED ORDER — ACETAMINOPHEN 500 MG PO TABS
1000.0000 mg | ORAL_TABLET | Freq: Four times a day (QID) | ORAL | Status: DC
  Administered 2022-10-26 – 2022-10-28 (×7): 1000 mg via ORAL
  Filled 2022-10-26 (×8): qty 2

## 2022-10-26 MED ORDER — ENOXAPARIN SODIUM 40 MG/0.4ML IJ SOSY
40.0000 mg | PREFILLED_SYRINGE | INTRAMUSCULAR | Status: DC
  Administered 2022-10-27: 40 mg via SUBCUTANEOUS
  Filled 2022-10-26 (×2): qty 0.4

## 2022-10-26 MED ORDER — PROPOFOL 10 MG/ML IV BOLUS
INTRAVENOUS | Status: AC
  Filled 2022-10-26: qty 20

## 2022-10-26 MED ORDER — ROCURONIUM BROMIDE 10 MG/ML (PF) SYRINGE
PREFILLED_SYRINGE | INTRAVENOUS | Status: AC
  Filled 2022-10-26: qty 10

## 2022-10-26 MED ORDER — MEPERIDINE HCL 50 MG/ML IJ SOLN: 6.2500 mg | INTRAMUSCULAR | Status: DC | PRN

## 2022-10-26 MED ORDER — SODIUM CHLORIDE 0.9 % IV SOLN
2.0000 g | Freq: Two times a day (BID) | INTRAVENOUS | Status: AC
  Administered 2022-10-26: 2 g via INTRAVENOUS
  Filled 2022-10-26: qty 2

## 2022-10-26 MED ORDER — SIMETHICONE 80 MG PO CHEW: 40.0000 mg | CHEWABLE_TABLET | Freq: Four times a day (QID) | ORAL | Status: DC | PRN

## 2022-10-26 MED ORDER — SUGAMMADEX SODIUM 200 MG/2ML IV SOLN
INTRAVENOUS | Status: DC | PRN
  Administered 2022-10-26: 100 mg via INTRAVENOUS

## 2022-10-26 MED ORDER — SODIUM CHLORIDE 0.9 % IV SOLN
2.0000 g | INTRAVENOUS | Status: AC
  Administered 2022-10-26: 2 g via INTRAVENOUS
  Filled 2022-10-26: qty 2

## 2022-10-26 MED ORDER — FENTANYL CITRATE (PF) 100 MCG/2ML IJ SOLN
INTRAMUSCULAR | Status: AC
  Filled 2022-10-26: qty 2

## 2022-10-26 MED ORDER — ONDANSETRON HCL 4 MG/2ML IJ SOLN: 4.0000 mg | Freq: Four times a day (QID) | INTRAMUSCULAR | Status: DC | PRN

## 2022-10-26 MED ORDER — HYDROMORPHONE HCL 1 MG/ML IJ SOLN: 0.5000 mg | INTRAMUSCULAR | Status: DC | PRN

## 2022-10-26 MED ORDER — BISACODYL 5 MG PO TBEC: 20.0000 mg | DELAYED_RELEASE_TABLET | Freq: Once | ORAL | Status: DC

## 2022-10-26 MED ORDER — CHLORHEXIDINE GLUCONATE 0.12 % MT SOLN
15.0000 mL | Freq: Once | OROMUCOSAL | Status: AC
  Administered 2022-10-26: 15 mL via OROMUCOSAL

## 2022-10-26 MED ORDER — DEXAMETHASONE SODIUM PHOSPHATE 10 MG/ML IJ SOLN
INTRAMUSCULAR | Status: DC | PRN
  Administered 2022-10-26: 4 mg via INTRAVENOUS

## 2022-10-26 MED ORDER — ACETAMINOPHEN 500 MG PO TABS
1000.0000 mg | ORAL_TABLET | ORAL | Status: AC
  Administered 2022-10-26: 1000 mg via ORAL
  Filled 2022-10-26: qty 2

## 2022-10-26 MED ORDER — POLYETHYLENE GLYCOL 3350 17 GM/SCOOP PO POWD: 1.0000 | Freq: Once | ORAL | Status: DC

## 2022-10-26 MED ORDER — FENTANYL CITRATE PF 50 MCG/ML IJ SOSY: 25.0000 ug | PREFILLED_SYRINGE | INTRAMUSCULAR | Status: DC | PRN

## 2022-10-26 MED ORDER — ENSURE PRE-SURGERY PO LIQD: 296.0000 mL | Freq: Once | ORAL | Status: DC

## 2022-10-26 MED ORDER — ALVIMOPAN 12 MG PO CAPS
12.0000 mg | ORAL_CAPSULE | ORAL | Status: AC
  Administered 2022-10-26: 12 mg via ORAL
  Filled 2022-10-26: qty 1

## 2022-10-26 MED ORDER — ENSURE SURGERY PO LIQD
237.0000 mL | Freq: Two times a day (BID) | ORAL | Status: DC
  Administered 2022-10-27 – 2022-10-28 (×3): 237 mL via ORAL

## 2022-10-26 MED ORDER — RINGERS IRRIGATION IR SOLN
Status: DC | PRN
  Administered 2022-10-26: 1000 mL

## 2022-10-26 MED ORDER — ONDANSETRON HCL 4 MG/2ML IJ SOLN
INTRAMUSCULAR | Status: DC | PRN
  Administered 2022-10-26: 4 mg via INTRAVENOUS

## 2022-10-26 MED ORDER — ONDANSETRON HCL 4 MG PO TABS: 4.0000 mg | ORAL_TABLET | Freq: Four times a day (QID) | ORAL | Status: DC | PRN

## 2022-10-26 MED ORDER — BUPIVACAINE LIPOSOME 1.3 % IJ SUSP
INTRAMUSCULAR | Status: DC | PRN
  Administered 2022-10-26: 20 mL

## 2022-10-26 MED ORDER — PHENYLEPHRINE HCL-NACL 20-0.9 MG/250ML-% IV SOLN
INTRAVENOUS | Status: DC | PRN
  Administered 2022-10-26: 20 ug/min via INTRAVENOUS

## 2022-10-26 MED ORDER — GABAPENTIN 100 MG PO CAPS
300.0000 mg | ORAL_CAPSULE | Freq: Two times a day (BID) | ORAL | Status: DC
  Administered 2022-10-26 – 2022-10-28 (×5): 300 mg via ORAL
  Filled 2022-10-26 (×5): qty 3

## 2022-10-26 MED ORDER — LACTATED RINGERS IV SOLN: INTRAVENOUS | Status: DC | PRN

## 2022-10-26 MED ORDER — ENSURE PRE-SURGERY PO LIQD
592.0000 mL | Freq: Once | ORAL | Status: DC
  Filled 2022-10-26: qty 592

## 2022-10-26 MED ORDER — INDOCYANINE GREEN 25 MG IV SOLR
INTRAVENOUS | Status: DC | PRN
  Administered 2022-10-26: 2.5 mg via INTRAVENOUS

## 2022-10-26 MED ORDER — DIPHENHYDRAMINE HCL 12.5 MG/5ML PO ELIX: 12.5000 mg | ORAL_SOLUTION | Freq: Four times a day (QID) | ORAL | Status: DC | PRN

## 2022-10-26 MED ORDER — ORAL CARE MOUTH RINSE: 15.0000 mL | Freq: Once | OROMUCOSAL | Status: AC

## 2022-10-26 MED ORDER — LACTATED RINGERS IV SOLN: INTRAVENOUS | Status: DC

## 2022-10-26 MED ORDER — SACCHAROMYCES BOULARDII 250 MG PO CAPS
250.0000 mg | ORAL_CAPSULE | Freq: Two times a day (BID) | ORAL | Status: DC
  Administered 2022-10-26 – 2022-10-28 (×5): 250 mg via ORAL
  Filled 2022-10-26 (×5): qty 1

## 2022-10-26 MED ORDER — BUPIVACAINE-EPINEPHRINE (PF) 0.25% -1:200000 IJ SOLN
INTRAMUSCULAR | Status: AC
  Filled 2022-10-26: qty 30

## 2022-10-26 MED ORDER — LIDOCAINE 2% (20 MG/ML) 5 ML SYRINGE
INTRAMUSCULAR | Status: DC | PRN
  Administered 2022-10-26: 60 mg via INTRAVENOUS

## 2022-10-26 MED ORDER — KCL IN DEXTROSE-NACL 20-5-0.45 MEQ/L-%-% IV SOLN
INTRAVENOUS | Status: DC
  Filled 2022-10-26: qty 1000

## 2022-10-26 MED ORDER — ACETAMINOPHEN 160 MG/5ML PO SOLN: 325.0000 mg | ORAL | Status: DC | PRN

## 2022-10-26 MED ORDER — ONDANSETRON HCL 4 MG/2ML IJ SOLN
INTRAMUSCULAR | Status: AC
  Filled 2022-10-26: qty 2

## 2022-10-26 MED ORDER — ALVIMOPAN 12 MG PO CAPS: 12.0000 mg | ORAL_CAPSULE | Freq: Two times a day (BID) | ORAL | Status: DC

## 2022-10-26 MED ORDER — METHYLENE BLUE 1 % INJ SOLN
INTRAVENOUS | Status: AC
  Filled 2022-10-26: qty 10

## 2022-10-26 MED ORDER — BUPIVACAINE LIPOSOME 1.3 % IJ SUSP: 20.0000 mL | Freq: Once | INTRAMUSCULAR | Status: DC

## 2022-10-26 MED ORDER — BUPIVACAINE-EPINEPHRINE 0.25% -1:200000 IJ SOLN
INTRAMUSCULAR | Status: DC | PRN
  Administered 2022-10-26: 30 mL

## 2022-10-26 SURGICAL SUPPLY — 99 items
ADH SKN CLS APL DERMABOND .7 (GAUZE/BANDAGES/DRESSINGS) ×1
BAG COUNTER SPONGE SURGICOUNT (BAG) ×1 IMPLANT
BAG SPNG CNTER NS LX DISP (BAG)
BLADE EXTENDED COATED 6.5IN (ELECTRODE) IMPLANT
CANNULA REDUC XI 12-8 STAPL (CANNULA)
CANNULA REDUCER 12-8 DVNC XI (CANNULA) IMPLANT
CELLS DAT CNTRL 66122 CELL SVR (MISCELLANEOUS) IMPLANT
COVER SURGICAL LIGHT HANDLE (MISCELLANEOUS) ×2 IMPLANT
COVER TIP SHEARS 8 DVNC (MISCELLANEOUS) ×1 IMPLANT
COVER TIP SHEARS 8MM DA VINCI (MISCELLANEOUS) ×1
DERMABOND ADVANCED .7 DNX12 (GAUZE/BANDAGES/DRESSINGS) IMPLANT
DRAIN CHANNEL 19F RND (DRAIN) IMPLANT
DRAPE ARM DVNC X/XI (DISPOSABLE) ×4 IMPLANT
DRAPE COLUMN DVNC XI (DISPOSABLE) ×1 IMPLANT
DRAPE DA VINCI XI ARM (DISPOSABLE) ×4
DRAPE DA VINCI XI COLUMN (DISPOSABLE) ×1
DRAPE SURG IRRIG POUCH 19X23 (DRAPES) ×1 IMPLANT
DRSG OPSITE POSTOP 4X10 (GAUZE/BANDAGES/DRESSINGS) IMPLANT
DRSG OPSITE POSTOP 4X6 (GAUZE/BANDAGES/DRESSINGS) IMPLANT
DRSG OPSITE POSTOP 4X8 (GAUZE/BANDAGES/DRESSINGS) IMPLANT
ELECT PENCIL ROCKER SW 15FT (MISCELLANEOUS) ×1 IMPLANT
ELECT REM PT RETURN 15FT ADLT (MISCELLANEOUS) ×1 IMPLANT
ENDOLOOP SUT PDS II  0 18 (SUTURE)
ENDOLOOP SUT PDS II 0 18 (SUTURE) IMPLANT
EVACUATOR SILICONE 100CC (DRAIN) IMPLANT
GLOVE BIO SURGEON STRL SZ 6.5 (GLOVE) ×3 IMPLANT
GLOVE BIOGEL PI IND STRL 7.0 (GLOVE) ×2 IMPLANT
GLOVE INDICATOR 6.5 STRL GRN (GLOVE) ×1 IMPLANT
GOWN SRG XL LVL 4 BRTHBL STRL (GOWNS) ×1 IMPLANT
GOWN STRL NON-REIN XL LVL4 (GOWNS) ×1
GOWN STRL REUS W/ TWL XL LVL3 (GOWN DISPOSABLE) ×3 IMPLANT
GOWN STRL REUS W/TWL XL LVL3 (GOWN DISPOSABLE) ×3
GRASPER SUT TROCAR 14GX15 (MISCELLANEOUS) IMPLANT
HOLDER FOLEY CATH W/STRAP (MISCELLANEOUS) ×1 IMPLANT
IRRIG SUCT STRYKERFLOW 2 WTIP (MISCELLANEOUS) ×1
IRRIGATION SUCT STRKRFLW 2 WTP (MISCELLANEOUS) ×1 IMPLANT
KIT PROCEDURE DA VINCI SI (MISCELLANEOUS)
KIT PROCEDURE DVNC SI (MISCELLANEOUS) IMPLANT
KIT TURNOVER KIT A (KITS) IMPLANT
NDL INSUFFLATION 14GA 120MM (NEEDLE) ×1 IMPLANT
NEEDLE INSUFFLATION 14GA 120MM (NEEDLE) ×1 IMPLANT
PACK CARDIOVASCULAR III (CUSTOM PROCEDURE TRAY) ×1 IMPLANT
PACK COLON (CUSTOM PROCEDURE TRAY) ×1 IMPLANT
PAD POSITIONING PINK XL (MISCELLANEOUS) ×1 IMPLANT
RELOAD STAPLE 60 2.5 WHT DVNC (STAPLE) IMPLANT
RELOAD STAPLE 60 3.5 BLU DVNC (STAPLE) IMPLANT
RELOAD STAPLE 60 4.3 GRN DVNC (STAPLE) IMPLANT
RELOAD STAPLER 2.5X60 WHT DVNC (STAPLE) ×1 IMPLANT
RELOAD STAPLER 3.5X60 BLU DVNC (STAPLE) ×1 IMPLANT
RELOAD STAPLER 4.3X60 GRN DVNC (STAPLE) ×1 IMPLANT
RETRACTOR WND ALEXIS 18 MED (MISCELLANEOUS) IMPLANT
RTRCTR WOUND ALEXIS 18CM MED (MISCELLANEOUS)
SCISSORS LAP 5X35 DISP (ENDOMECHANICALS) IMPLANT
SEAL UNIV 5-12 XI (MISCELLANEOUS) ×3 IMPLANT
SEAL XI UNIVERSAL 5-12 (MISCELLANEOUS) ×3
SEALER VESSEL DA VINCI XI (MISCELLANEOUS) ×1
SEALER VESSEL EXT DVNC XI (MISCELLANEOUS) ×1 IMPLANT
SOL ELECTROSURG ANTI STICK (MISCELLANEOUS) ×1
SOLUTION ELECTROSURG ANTI STCK (MISCELLANEOUS) ×1 IMPLANT
SPIKE FLUID TRANSFER (MISCELLANEOUS) IMPLANT
STAPLER 60 DA VINCI SURE FORM (STAPLE) ×1
STAPLER 60 SUREFORM DVNC (STAPLE) IMPLANT
STAPLER ECHELON POWER CIR 29 (STAPLE) IMPLANT
STAPLER ECHELON POWER CIR 31 (STAPLE) IMPLANT
STAPLER RELOAD 2.5X60 WHITE (STAPLE) ×1
STAPLER RELOAD 2.5X60 WHT DVNC (STAPLE) ×1
STAPLER RELOAD 3.5X60 BLU DVNC (STAPLE) ×1
STAPLER RELOAD 3.5X60 BLUE (STAPLE) ×1
STAPLER RELOAD 4.3X60 GREEN (STAPLE) ×1
STAPLER RELOAD 4.3X60 GRN DVNC (STAPLE) ×1
STOPCOCK 4 WAY LG BORE MALE ST (IV SETS) ×2 IMPLANT
SUT ETHILON 2 0 PS N (SUTURE) IMPLANT
SUT NOVA NAB GS-21 1 T12 (SUTURE) ×2 IMPLANT
SUT PROLENE 2 0 KS (SUTURE) IMPLANT
SUT SILK 2 0 (SUTURE) ×1
SUT SILK 2 0 SH CR/8 (SUTURE) IMPLANT
SUT SILK 2-0 18XBRD TIE 12 (SUTURE) ×1 IMPLANT
SUT SILK 3 0 (SUTURE)
SUT SILK 3 0 SH CR/8 (SUTURE) ×1 IMPLANT
SUT SILK 3-0 18XBRD TIE 12 (SUTURE) IMPLANT
SUT V-LOC BARB 180 2/0GR6 GS22 (SUTURE) ×2
SUT VIC AB 2-0 SH 18 (SUTURE) IMPLANT
SUT VIC AB 2-0 SH 27 (SUTURE)
SUT VIC AB 2-0 SH 27X BRD (SUTURE) IMPLANT
SUT VIC AB 3-0 SH 18 (SUTURE) IMPLANT
SUT VIC AB 4-0 PS2 27 (SUTURE) ×2 IMPLANT
SUT VICRYL 0 UR6 27IN ABS (SUTURE) ×1 IMPLANT
SUTURE V-LC BRB 180 2/0GR6GS22 (SUTURE) IMPLANT
SYR 20ML ECCENTRIC (SYRINGE) ×1 IMPLANT
SYS LAPSCP GELPORT 120MM (MISCELLANEOUS)
SYS WOUND ALEXIS 18CM MED (MISCELLANEOUS)
SYSTEM LAPSCP GELPORT 120MM (MISCELLANEOUS) IMPLANT
SYSTEM WOUND ALEXIS 18CM MED (MISCELLANEOUS) IMPLANT
TOWEL OR 17X26 10 PK STRL BLUE (TOWEL DISPOSABLE) IMPLANT
TOWEL OR NON WOVEN STRL DISP B (DISPOSABLE) ×1 IMPLANT
TRAY FOLEY MTR SLVR 16FR STAT (SET/KITS/TRAYS/PACK) ×1 IMPLANT
TROCAR ADV FIXATION 5X100MM (TROCAR) ×1 IMPLANT
TUBING CONNECTING 10 (TUBING) ×2 IMPLANT
TUBING INSUFFLATION 10FT LAP (TUBING) ×1 IMPLANT

## 2022-10-26 NOTE — Op Note (Signed)
10/26/2022  9:31 AM  PATIENT:  Ashley Gonzalez  77 y.o. female  Patient Care Team: Maury Dus, MD (Inactive) as PCP - General (Family Medicine)  PRE-OPERATIVE DIAGNOSIS:  COLON POLYP  POST-OPERATIVE DIAGNOSIS:  COLON POLYP  PROCEDURE: XI ROBOT ASSISTED LAPAROSCOPIC PARTIAL COLECTOMY   Surgeon(s): Leighton Ruff, MD   ASSISTANT: Maczis, Carlena Hurl, PA-C   ANESTHESIA:   local and general  EBL: 50 ml Total I/O In: 800 [I.V.:700; IV Piggyback:100] Out: 220 [Urine:70; Other:150]  Delay start of Pharmacological VTE agent (>24hrs) due to surgical blood loss or risk of bleeding:  no  DRAINS: none   SPECIMEN:  Source of Specimen:  R colon  DISPOSITION OF SPECIMEN:  PATHOLOGY  COUNTS:  YES  PLAN OF CARE: Admit to inpatient   PATIENT DISPOSITION:  PACU - hemodynamically stable.  INDICATION:      I recommended segmental resection:  The anatomy & physiology of the digestive tract was discussed.  The pathophysiology was discussed.  Natural history risks without surgery was discussed.   I worked to give an overview of the disease and the frequent need to have multispecialty involvement.  I feel the risks of no intervention will lead to serious problems that outweigh the operative risks; therefore, I recommended a partial colectomy to remove the pathology.  Laparoscopic & open techniques were discussed.   Risks such as bleeding, infection, abscess, leak, reoperation, possible ostomy, hernia, heart attack, death, and other risks were discussed.  I noted a good likelihood this will help address the problem.   Goals of post-operative recovery were discussed as well.    The patient expressed understanding & wished to proceed with surgery.  OR FINDINGS:   Patient had mass in the cecum  No obvious metastatic disease on visceral parietal peritoneum or liver.  DESCRIPTION:   Informed consent was confirmed.  The patient underwent general anaesthesia without difficulty.  The patient  was positioned appropriately.  VTE prevention in place.  The patient's abdomen was clipped, prepped, & draped in a sterile fashion.  Surgical timeout confirmed our plan.  The patient was positioned in reverse Trendelenburg.  Abdominal entry was gained using a Varies needle in the LUQ.  Entry was clean.  I induced carbon dioxide insufflation.  An 22mm robotic port was placed in the LUQ.  Camera inspection revealed a small liver puncture that was not actively bleeding.  Extra ports were carefully placed under direct laparoscopic visualization.  I laparoscopically reflected the greater omentum and the upper abdomen the small bowel in the peilvis. The patient was appropriately positioned and the robot was docked to the patient's right side.  Instruments were placed under direct visualization.    I began by identifying the ileocolic artery and vein within the mesentery. Dissection was bluntly carried around these structures. The duodenum was identified and free from the structures. I then separated the structures bluntly and used the robotic vessel sealer device to transect these.  I developed the retroperitoneal plane bluntly.  I then freed the appendix off its attachments to the pelvic wall. I mobilized the terminal ileum.  I took care to avoid injuring any retroperitoneal structures.  After this I began to mobilize laterally down the white line of [place] and then took down the hepatic flexure using the Enseal device. I mobilized the omentum off of the right transverse colon. The entire colon was then flipped medially and mobilized off of the retroperitoneal structures until I could visualize the lateral edge of the duodenum underneath.  I gently freed the duodenal attachments.   I identified a portion of mesentery of the transverse colon just proximal to the right branch of the middle colic.  I divided up to the colon from the previous dissection of the mesentery using the robotic vessel sealer.  I then divided  the terminal ileal mesentery in similar fashion.  At that point, the terminal ileum was divided with a blue load robotic 60 mm stapler.  The transverse colon was divided with a green load robotic stapler.  The specimen was then completely free and placed in the right upper quadrant.  Hemostasis was good.  I then oriented the remaining terminal ileum and transverse colon and a isoperistaltic fashion.  I placed an enterotomy in the small bowel and colon using the robotic scissors.  I then introduced a white load 60 mm robotic stapler into both enterotomies and created an anastomosis between the small bowel and transverse colon.  Hemostasis within the staple line was good.  The common enterotomy channel was closed using 2, running 2-0 V-Loc sutures.  The abdomen was then irrigated with normal saline. The omentum was then brought down over the anastomosis.  At this point the robot was undocked.  The 12 mm suprapubic port was enlarged to a Pfannenstiel incision and an [place] wound protector was placed.  The specimen was removed from the abdomen and evaluated.  Once the abdomen was inspected for hemostasis, the [place] wound protector was removed.    The peritoneum of the Pfannenstiel incision was closed using a running 0 Vicryl suture.  The fascia was then closed using 2, running #1 Novafil sutures.  The subcutaneous tissue of the extraction incision was closed using a running 2-0 Vicryl suture. The skin was then closed using running subcuticular 4-0 Vicryl sutures.  A sterile dressing was applied.  The remaining port sites were closed using interrupted 4-0 Vicryl sutures and Dermabond. All counts were correct per operating room staff. The patient was then awakened from anesthesia and sent to the post anesthesia care unit in stable condition.     Rosario Adie, MD  Colorectal and [place] [facility]

## 2022-10-26 NOTE — Transfer of Care (Signed)
Immediate Anesthesia Transfer of Care Note  Patient: Ashley Gonzalez  Procedure(s) Performed: XI ROBOT ASSISTED LAPAROSCOPIC PARTIAL COLECTOMY  Patient Location: PACU  Anesthesia Type:General  Level of Consciousness: awake, alert , and patient cooperative  Airway & Oxygen Therapy: Patient Spontanous Breathing and Patient connected to face mask oxygen  Post-op Assessment: Report given to RN and Post -op Vital signs reviewed and stable  Post vital signs: Reviewed and stable  Last Vitals:  Vitals Value Taken Time  BP 138/63 10/26/22 0944  Temp    Pulse 77 10/26/22 0945  Resp 22 10/26/22 0945  SpO2 100 % 10/26/22 0945  Vitals shown include unvalidated device data.  Last Pain:  Vitals:   10/26/22 0602  TempSrc: Oral  PainSc:          Complications: No notable events documented.

## 2022-10-26 NOTE — H&P (Signed)
REFERRING PHYSICIAN:  Mansouraty, Telford Nab.*   PROVIDER:  Monico Blitz, MD   MRN: [id number] DOB: 12-27-1945   Subjective    Chief Complaint: No chief complaint on file.       History of Present Illness: Ashley Gonzalez is a 77 y.o. female who is seen today as an office consultation at the request of Dr. Rush Landmark for evaluation of No chief complaint on file. .  77 year old female who has been undergoing endoscopic resections of a cecal polyp since 2021.  She has had 3 attempts to remove this polyp, but they have been unsuccessful.  She is here today to discuss surgery.  Most recent pathology shows traditional serrated adenoma with high-grade dysplasia.  Patient has no major medical problems.  She has never had any abdominal surgeries.  She has had some significant weight loss recently.  She feels that she was not eating enough and exercising too much.  She is working on increasing her diet to help with this.       Review of Systems: A complete review of systems was obtained from the patient.  I have reviewed this information and discussed as appropriate with the patient.  See HPI as well for other ROS.     Medical History: Past Medical History         Past Medical History:  Diagnosis Date   Arthritis     Hyperlipidemia          There is no problem list on file for this patient.     Past Surgical History  History reviewed. No pertinent surgical history.      Allergies  No Known Allergies                 Current Outpatient Medications on File Prior to Visit  Medication Sig Dispense Refill   alendronate (FOSAMAX) 70 MG tablet Take by mouth       calcium carbonate-vitamin D3 600 mg-20 mcg (800 unit) Tab Take 1 tablet by mouth once daily       ferrous sulfate (IRON) 325 (65 FE) MG tablet Take by mouth       multivitamin tablet Take 1 tablet by mouth once daily       rosuvastatin (CRESTOR) 20 MG tablet Take 20 mg by mouth once daily        No current  facility-administered medications on file prior to visit.      Family History           Family History  Problem Relation Age of Onset   Diabetes Sister     Breast cancer Sister     Diabetes Brother          Social History         Tobacco Use  Smoking Status Never  Smokeless Tobacco Never      Social History  Social History           Socioeconomic History   Marital status: Single  Tobacco Use   Smoking status: Never   Smokeless tobacco: Never  Vaping Use   Vaping Use: Never used  Substance and Sexual Activity   Alcohol use: Never   Drug use: Never        Objective:      Vitals:   10/26/22 0602  BP: 119/69  Pulse: 74  Resp: 15  Temp: 98.1 F (36.7 C)  SpO2: 100%      Exam Gen: NAD Abd: soft  Labs, Imaging and Diagnostic Testing: Colonoscopy reports and pathology reports reviewed.   Assessment and Plan:  Diagnoses and all orders for this visit:   Adenomatous polyp of ascending colon       77 year old female female with an endoscopically unresectable cecal polyp across from the ileocecal valve.  There is high-grade dysplasia noted in the pathology report.  We discussed that there is a possibility that there is an underlying cancer in her colon.  We discussed performing a cancer operation including lymph node resection.  We discussed the details of a right hemicolectomy in risk associated with this.  All questions were answered.   The surgery and anatomy were described to the patient as well as the risks of surgery and the possible complications.  These include: Bleeding, deep abdominal infections and possible wound complications such as hernia and infection, damage to adjacent structures, leak of surgical connections, which can lead to other surgeries and possibly an ostomy, possible need for other procedures, such as abscess drains in radiology, possible prolonged hospital stay, possible diarrhea from removal of part of the colon, possible  constipation from narcotics, possible bowel, bladder or sexual dysfunction if having rectal surgery, prolonged fatigue/weakness or appetite loss, possible early recurrence of of disease, possible complications of their medical problems such as heart disease or arrhythmias or lung problems, death (less than 1%). I believe the patient understands and wishes to proceed with the surgery.    No follow-ups on file.    Rosario Adie, MD Colon and Rectal Surgery [facility]

## 2022-10-26 NOTE — Anesthesia Procedure Notes (Signed)
Procedure Name: Intubation Date/Time: 10/26/2022 7:37 AM  Performed by: Eben Burow, CRNAPre-anesthesia Checklist: Patient identified, Emergency Drugs available, Suction available, Patient being monitored and Timeout performed Patient Re-evaluated:Patient Re-evaluated prior to induction Oxygen Delivery Method: Circle system utilized Preoxygenation: Pre-oxygenation with 100% oxygen Induction Type: IV induction Ventilation: Mask ventilation without difficulty Laryngoscope Size: Mac and 4 Grade View: Grade I Tube type: Oral Tube size: 7.0 mm Number of attempts: 1 Airway Equipment and Method: Stylet Placement Confirmation: ETT inserted through vocal cords under direct vision, positive ETCO2 and breath sounds checked- equal and bilateral Secured at: 21 cm Tube secured with: Tape Dental Injury: Teeth and Oropharynx as per pre-operative assessment

## 2022-10-26 NOTE — Discharge Instructions (Signed)
SURGERY: POST OP INSTRUCTIONS (Surgery for small bowel obstruction, colon resection, etc)   ######################################################################  EAT Gradually transition to a high fiber diet with a fiber supplement over the next few days after discharge  WALK Walk an hour a day.  Control your pain to do that.    CONTROL PAIN Control pain so that you can walk, sleep, tolerate sneezing/coughing, go up/down stairs.  HAVE A BOWEL MOVEMENT DAILY Keep your bowels regular to avoid problems.  OK to try a laxative to override constipation.  OK to use an antidairrheal to slow down diarrhea.  Call if not better after 2 tries  CALL IF YOU HAVE PROBLEMS/CONCERNS Call if you are still struggling despite following these instructions. Call if you have concerns not answered by these instructions  ######################################################################   DIET Follow a light diet the first few days at home.  Start with a bland diet such as soups, liquids, starchy foods, low fat foods, etc.  If you feel full, bloated, or constipated, stay on a ful liquid or pureed/blenderized diet for a few days until you feel better and no longer constipated. Be sure to drink plenty of fluids every day to avoid getting dehydrated (feeling dizzy, not urinating, etc.). Gradually add a fiber supplement to your diet over the next week.  Gradually get back to a regular solid diet.  Avoid fast food or heavy meals the first week as you are more likely to get nauseated. It is expected for your digestive tract to need a few months to get back to normal.  It is common for your bowel movements and stools to be irregular.  You will have occasional bloating and cramping that should eventually fade away.  Until you are eating solid food normally, off all pain medications, and back to regular activities; your bowels will not be normal. Focus on eating a low-fat, high fiber diet the rest of your life  (See Getting to Good Bowel Health, below).  CARE of your INCISION or WOUND  It is good for closed incisions and even open wounds to be washed every day.  Shower every day.  Short baths are fine.  Wash the incisions and wounds clean with soap & water.    You may leave closed incisions open to air if it is dry.   You may cover the incision with clean gauze & replace it after your daily shower for comfort.  STAPLES: You have skin staples.  Leave them in place & set up an appointment for them to be removed by a surgery office nurse ~10 days after surgery. = 1st week of January 2024    ACTIVITIES as tolerated Start light daily activities --- self-care, walking, climbing stairs-- beginning the day after surgery.  Gradually increase activities as tolerated.  Control your pain to be active.  Stop when you are tired.  Ideally, walk several times a day, eventually an hour a day.   Most people are back to most day-to-day activities in a few weeks.  It takes 4-8 weeks to get back to unrestricted, intense activity. If you can walk 30 minutes without difficulty, it is safe to try more intense activity such as jogging, treadmill, bicycling, low-impact aerobics, swimming, etc. Save the most intensive and strenuous activity for last (Usually 4-8 weeks after surgery) such as sit-ups, heavy lifting, contact sports, etc.  Refrain from any intense heavy lifting or straining until you are off narcotics for pain control.  You will have off days, but things should improve   week-by-week. DO NOT PUSH THROUGH PAIN.  Let pain be your guide: If it hurts to do something, don't do it.  Pain is your body warning you to avoid that activity for another week until the pain goes down. You may drive when you are no longer taking narcotic prescription pain medication, you can comfortably wear a seatbelt, and you can safely make sudden turns/stops to protect yourself without hesitating due to pain. You may have sexual intercourse when it  is comfortable. If it hurts to do something, stop.  MEDICATIONS Take your usually prescribed home medications unless otherwise directed.   Blood thinners:  Usually you can restart any strong blood thinners after the second postoperative day.  It is OK to take aspirin right away.     If you are on strong blood thinners (warfarin/Coumadin, Plavix, Xerelto, Eliquis, Pradaxa, etc), discuss with your surgeon, medicine PCP, and/or cardiologist for instructions on when to restart the blood thinner & if blood monitoring is needed (PT/INR blood check, etc).     PAIN CONTROL Pain after surgery or related to activity is often due to strain/injury to muscle, tendon, nerves and/or incisions.  This pain is usually short-term and will improve in a few months.  To help speed the process of healing and to get back to regular activity more quickly, DO THE FOLLOWING THINGS TOGETHER: Increase activity gradually.  DO NOT PUSH THROUGH PAIN Use Ice and/or Heat Try Gentle Massage and/or Stretching Take over the counter pain medication Take Narcotic prescription pain medication for more severe pain  Good pain control = faster recovery.  It is better to take more medicine to be more active than to stay in bed all day to avoid medications.  Increase activity gradually Avoid heavy lifting at first, then increase to lifting as tolerated over the next 6 weeks. Do not "push through" the pain.  Listen to your body and avoid positions and maneuvers than reproduce the pain.  Wait a few days before trying something more intense Walking an hour a day is encouraged to help your body recover faster and more safely.  Start slowly and stop when getting sore.  If you can walk 30 minutes without stopping or pain, you can try more intense activity (running, jogging, aerobics, cycling, swimming, treadmill, sex, sports, weightlifting, etc.) Remember: If it hurts to do it, then don't do it! Use Ice and/or Heat You will have swelling and  bruising around the incisions.  This will take several weeks to resolve. Ice packs or heating pads (6-8 times a day, 30-60 minutes at a time) will help sooth soreness & bruising. Some people prefer to use ice alone, heat alone, or alternate between ice & heat.  Experiment and see what works best for you.  Consider trying ice for the first few days to help decrease swelling and bruising; then, switch to heat to help relax sore spots and speed recovery. Shower every day.  Short baths are fine.  It feels good!  Keep the incisions and wounds clean with soap & water.   Try Gentle Massage and/or Stretching Massage at the area of pain many times a day Stop if you feel pain - do not overdo it Take over the counter pain medication This helps the muscle and nerve tissues become less irritable and calm down faster Choose ONE of the following over-the-counter anti-inflammatory medications: Acetaminophen 500mg tabs (Tylenol) 1-2 pills with every meal and just before bedtime (avoid if you have liver problems or if you have   acetaminophen in you narcotic prescription) Naproxen 220mg tabs (ex. Aleve, Naprosyn) 1-2 pills twice a day (avoid if you have kidney, stomach, IBD, or bleeding problems) Ibuprofen 200mg tabs (ex. Advil, Motrin) 3-4 pills with every meal and just before bedtime (avoid if you have kidney, stomach, IBD, or bleeding problems) Take with food/snack several times a day as directed for at least 2 weeks to help keep pain / soreness down & more manageable. Take Narcotic prescription pain medication for more severe pain A prescription for strong pain control is often given to you upon discharge (for example: oxycodone/Percocet, hydrocodone/Norco/Vicodin, or tramadol/Ultram) Take your pain medication as prescribed. Be mindful that most narcotic prescriptions contain Tylenol (acetaminophen) as well - avoid taking too much Tylenol. If you are having problems/concerns with the prescription medicine (does  not control pain, nausea, vomiting, rash, itching, etc.), please call us [phone] to see if we need to switch you to a different pain medicine that will work better for you and/or control your side effects better. If you need a refill on your pain medication, you must call the office before 4 pm and on weekdays only.  By federal law, prescriptions for narcotics cannot be called into a pharmacy.  They must be filled out on paper & picked up from our office by the patient or authorized caretaker.  Prescriptions cannot be filled after 4 pm nor on weekends.    WHEN TO CALL US [phone] Severe uncontrolled or worsening pain  Fever over 101 F (38.5 C) Concerns with the incision: Worsening pain, redness, rash/hives, swelling, bleeding, or drainage Reactions / problems with new medications (itching, rash, hives, nausea, etc.) Nausea and/or vomiting Difficulty urinating Difficulty breathing Worsening fatigue, dizziness, lightheadedness, blurred vision Other concerns If you are not getting better after two weeks or are noticing you are getting worse, contact our office [phone] for further advice.  We may need to adjust your medications, re-evaluate you in the office, send you to the emergency room, or see what other things we can do to help. The clinic staff is available to answer your questions during regular business hours (8:30am-5pm).  Please don't hesitate to call and ask to speak to one of our nurses for clinical concerns.    A surgeon from [facility] is always on call at the hospitals 24 hours/day If you have a medical emergency, go to the nearest emergency room or call 911.  FOLLOW UP in our office One the day of your discharge from the hospital (or the next business weekday), please call [facility] to set up or confirm an appointment to see your surgeon in the office for a follow-up appointment.  Usually it is 2-3 weeks after your surgery.   If you  have skin staples at your incision(s), let the office know so we can set up a time in the office for the nurse to remove them (usually around 10 days after surgery). Make sure that you call for appointments the day of discharge (or the next business weekday) from the hospital to ensure a convenient appointment time. IF YOU HAVE DISABILITY OR FAMILY LEAVE FORMS, BRING THEM TO THE OFFICE FOR PROCESSING.  DO NOT GIVE THEM TO YOUR DOCTOR.  [facility] [address], [address], [place], [place]  [address] ? [phone] - Main [phone] - Toll Free,  [phone] - Fax [email or web address]    GETTING TO GOOD BOWEL HEALTH. It is expected for your digestive tract to   need a few months to get back to normal.  It is common for your bowel movements and stools to be irregular.  You will have occasional bloating and cramping that should eventually fade away.  Until you are eating solid food normally, off all pain medications, and back to regular activities; your bowels will not be normal.   Avoiding constipation The goal: ONE SOFT BOWEL MOVEMENT A DAY!    Drink plenty of fluids.  Choose water first. TAKE A FIBER SUPPLEMENT EVERY DAY THE REST OF YOUR LIFE During your first week back home, gradually add back a fiber supplement every day Experiment which form you can tolerate.   There are many forms such as powders, tablets, wafers, gummies, etc Psyllium bran (Metamucil), methylcellulose (Citrucel), Miralax or Glycolax, Benefiber, Flax Seed.  Adjust the dose week-by-week (1/2 dose/day to 6 doses a day) until you are moving your bowels 1-2 times a day.  Cut back the dose or try a different fiber product if it is giving you problems such as diarrhea or bloating. Sometimes a laxative is needed to help jump-start bowels if constipated until the fiber supplement can help regulate your bowels.  If you are tolerating eating & you are farting, it is okay to try a gentle  laxative such as double dose MiraLax, prune juice, or Milk of Magnesia.  Avoid using laxatives too often. Stool softeners can sometimes help counteract the constipating effects of narcotic pain medicines.  It can also cause diarrhea, so avoid using for too long. If you are still constipated despite taking fiber daily, eating solids, and a few doses of laxatives, call our office. Controlling diarrhea Try drinking liquids and eating bland foods for a few days to avoid stressing your intestines further. Avoid dairy products (especially milk & ice cream) for a short time.  The intestines often can lose the ability to digest lactose when stressed. Avoid foods that cause gassiness or bloating.  Typical foods include beans and other legumes, cabbage, broccoli, and dairy foods.  Avoid greasy, spicy, fast foods.  Every person has some sensitivity to other foods, so listen to your body and avoid those foods that trigger problems for you. Probiotics (such as active yogurt, Align, etc) may help repopulate the intestines and colon with normal bacteria and calm down a sensitive digestive tract Adding a fiber supplement gradually can help thicken stools by absorbing excess fluid and retrain the intestines to act more normally.  Slowly increase the dose over a few weeks.  Too much fiber too soon can backfire and cause cramping & bloating. It is okay to try and slow down diarrhea with a few doses of antidiarrheal medicines.   Bismuth subsalicylate (ex. Kayopectate, Pepto Bismol) for a few doses can help control diarrhea.  Avoid if pregnant.   Loperamide (Imodium) can slow down diarrhea.  Start with one tablet (2mg) first.  Avoid if you are having fevers or severe pain.  ILEOSTOMY PATIENTS WILL HAVE CHRONIC DIARRHEA since their colon is not in use.    Drink plenty of liquids.  You will need to drink even more glasses of water/liquid a day to avoid getting dehydrated. Record output from your ileostomy.  Expect to empty  the bag every 3-4 hours at first.  Most people with a permanent ileostomy empty their bag 4-6 times at the least.   Use antidiarrheal medicine (especially Imodium) several times a day to avoid getting dehydrated.  Start with a dose at bedtime & breakfast.  Adjust up or   down as needed.  Increase antidiarrheal medications as directed to avoid emptying the bag more than 8 times a day (every 3 hours). Work with your wound ostomy nurse to learn care for your ostomy.  See ostomy care instructions. TROUBLESHOOTING IRREGULAR BOWELS 1) Start with a soft & bland diet. No spicy, greasy, or fried foods.  2) Avoid gluten/wheat or dairy products from diet to see if symptoms improve. 3) Miralax 17gm or flax seed mixed in 8oz. water or juice-daily. May use 2-4 times a day as needed. 4) Gas-X, Phazyme, etc. as needed for gas & bloating.  5) Prilosec (omeprazole) over-the-counter as needed 6)  Consider probiotics (Align, Activa, etc) to help calm the bowels down  Call your doctor if you are getting worse or not getting better.  Sometimes further testing (cultures, endoscopy, X-ray studies, CT scans, bloodwork, etc.) may be needed to help diagnose and treat the cause of the diarrhea. [facility] [address], [address], [place],   [address] [phone] - Main.    [phone]  - Toll Free.   [phone] - Fax [email or web address]   ###############################   #######################################################  Ostomy Support Information  You've heard that people get along just fine with only one of their eyes, or one of their lungs, or one of their kidneys. But you also know that you have only one intestine and only one bladder, and that leaves you feeling awfully empty, both physically and emotionally: You think no other people go around without part of their intestine with the ends of their intestines sticking out through their abdominal walls.    YOU ARE NOT ALONE.  There are nearly three quarters of a million people in the [place] who have an ostomy; people who have had surgery to remove all or part of their colons or bladders.   There is even a national association, the [employer] with over 350 local affiliated support groups that are organized by volunteers who provide peer support and counseling. [employer] has a toll free telephone num-ber, 800-826-0826 and an educational, interactive website, www.ostomy.org   An ostomy is an opening in the belly (abdominal wall) made by surgery. Ostomates are people who have had this procedure. The opening (stoma) allows the kidney or bowel to grdischarge waste. An external pouch covers the stoma to collect waste. Pouches are are a simple bag and are odor free. Different companies have disposable or reusable pouches to fit one's lifestyle. An ostomy can either be temporary or permanent.   THERE ARE THREE MAIN TYPES OF OSTOMIES Colostomy. A colostomy is a surgically created opening in the large intestine (colon). Ileostomy. An ileostomy is a surgically created opening in the small intestine. Urostomy. A urostomy is a surgically created opening to divert urine away from the bladder.  OSTOMY Care  The following guidelines will make care of your colostomy easier. Keep this information close by for quick reference.  Helpful DIET hints Eat a well-balanced diet including vegetables and fresh fruits. Eat on a regular schedule.  Drink at least 6 to 8 glasses of fluids daily. Eat slowly in a relaxed atmosphere. Chew your food thoroughly. Avoid chewing gum, smoking, and drinking from a straw. This will help decrease the amount of air you swallow, which may help reduce gas. Eating yogurt or drinking buttermilk may help reduce gas.  To control gas at night, do not eat after 8 p.m. This will give your bowel time to quiet down before you go   to bed.  If gas is a problem, you can purchase  Beano. Sprinkle Beano on the first bite of food before eating to reduce gas. It has no flavor and should not change the taste of your food. You can buy Beano over the counter at your local drugstore.  Foods like fish, onions, garlic, broccoli, asparagus, and cabbage produce odor. Although your pouch is odor-proof, if you eat these foods you may notice a stronger odor when emptying your pouch. If this is a concern, you may want to limit these foods in your diet.  If you have an ileostomy, you will have chronic diarrhea & need to drink more liquids to avoid getting dehydrated.  Consider antidiarrheal medicine like imodium (loperamide) or Lomotil to help slow down bowel movements / diarrhea into your ileostomy bag.  GETTING TO GOOD BOWEL HEALTH WITH AN ILEOSTOMY    With the colon bypassed & not in use, you will have small bowel diarrhea.   It is important to thicken & slow your bowel movements down.   The goal: 4-6 small BOWEL MOVEMENTS A DAY It is important to drink plenty of liquids to avoid getting dehydrated  CONTROLLING ILEOSTOMY DIARRHEA  TAKE A FIBER SUPPLEMENT (FiberCon or Benefiner soluble fiber) twice a day - to thicken stools by absorbing excess fluid and retrain the intestines to act more normally.  Slowly increase the dose over a few weeks.  Too much fiber too soon can backfire and cause cramping & bloating.  TAKE AN IRON SUPPLEMENT twice a day to naturally constipate your bowels.  Usually ferrous sulfate 325mg twice a day)  TAKE ANTI-DIARRHEAL MEDICINES: Loperamide (Imodium) can slow down diarrhea.  Start with two tablets (= 4mg) first and then try one tablet every 6 hours.  Can go up to 2 pills four times day (8 pills of 2mg max) Avoid if you are having fevers or severe pain.  If you are not better or start feeling worse, stop all medicines and call your doctor for advice LoMotil (Diphenoxylate / Atropine) is another medicine that can constipate & slow down bowel moevements Pepto  Bismol (bismuth) can gently thicken bowels as well  If diarrhea is worse,: drink plenty of liquids and try simpler foods for a few days to avoid stressing your intestines further. Avoid dairy products (especially milk & ice cream) for a short time.  The intestines often can lose the ability to digest lactose when stressed. Avoid foods that cause gassiness or bloating.  Typical foods include beans and other legumes, cabbage, broccoli, and dairy foods.  Every person has some sensitivity to other foods, so listen to our body and avoid those foods that trigger problems for you.Call your doctor if you are getting worse or not better.  Sometimes further testing (cultures, endoscopy, X-ray studies, bloodwork, etc) may be needed to help diagnose and treat the cause of the diarrhea. Take extra anti-diarrheal medicines (maximum is 8 pills of 2mg loperamide a day)   Tips for POUCHING an OSTOMY   Changing Your Pouch The best time to change your pouch is in the morning, before eating or drinking anything. Your stoma can function at any time, but it will function more after eating or drinking.   Applying the pouching system  Place all your equipment close at hand before removing your pouch.  Wash your hands.  Stand or sit in front of a mirror. Use the position that works best for you. Remember that you must keep the skin around the stoma   wrinkle-free for a good seal.  Gently remove the used pouch (1-piece system) or the pouch and old wafer (2-piece system). Empty the pouch into the toilet. Save the closure clip to use again.  Wash the stoma itself and the skin around the stoma. Your stoma may bleed a little when being washed. This is normal. Rinse and pat dry. You may use a wash cloth or soft paper towels (like Bounty), mild soap (like Dial, Safeguard, or Ivory), and water. Avoid soaps that contain perfumes or lotions.  For a new pouch (1-piece system) or a new wafer (2-piece system), measure your  stoma using the stoma guide in each box of supplies.  Trace the shape of your stoma onto the back of the new pouch or the back of the new wafer. Cut out the opening. Remove the paper backing and set it aside.  Optional: Apply a skin barrier powder to surrounding skin if it is irritated (bare or weeping), and dust off the excess. Optional: Apply a skin-prep wipe (such as Skin Prep or All-Kare) to the skin around the stoma, and let it dry. Do not apply this solution if the skin is irritated (red, tender, or broken) or if you have shaved around the stoma. Optional: Apply a skin barrier paste (such as Stomahesive, Coloplast, or Premium) around the opening cut in the back of the pouch or wafer. Allow it to dry for 30 to 60 seconds.  Hold the pouch (1-piece system) or wafer (2-piece system) with the sticky side toward your body. Make sure the skin around the stoma is wrinkle-free. Center the opening on the stoma, then press firmly to your abdomen (Fig. 4). Look in the mirror to check if you are placing the pouch, or wafer, in the right position. For a 2-piece system, snap the pouch onto the wafer. Make sure it snaps into place securely.  Place your hand over the stoma and the pouch or wafer for about 30 seconds. The heat from your hand can help the pouch or wafer stick to your skin.  Add deodorant (such as Super Banish or Nullo) to your pouch. Other options include food extracts such as vanilla oil and peppermint extract. Add about 10 drops of the deodorant to the pouch. Then apply the closure clamp. Note: Do not use toxic  chemicals or commercial cleaning agents in your pouch. These substances may harm the stoma.  Optional: For extra seal, apply tape to all 4 sides around the pouch or wafer, as if you were framing a picture. You may use any brand of medical adhesive tape. Change your pouch every 5 to 7 days. Change it immediately if a leak occurs.  Wash your hands afterwards.  If you are wearing a  2-piece system, you may use 2 new pouches per week and alternate them. Rinse the pouch with mild soap and warm water and hang it to dry for the next day. Apply the fresh pouch. Alternate the 2 pouches like this for a week. After a week, change the wafer and begin with 2 new pouches. Place the old pouches in a plastic bag, and put them in the trash.   LIVING WITH AN OSTOMY  Emptying Your Pouch Empty your pouch when it is one-third full (of urine, stool, and/or gas). If you wait until your pouch is fuller than this, it will be more difficult to empty and more noticeable. When you empty your pouch, either put toilet paper in the toilet bowl first, or flush the   toilet while you empty the pouch. This will reduce splashing. You can empty the pouch between your legs or to one side while sitting, or while standing or stooping. If you have a 2-piece system, you can snap off the pouch to empty it. Remember that your stoma may function during this time. If you wish to rinse your pouch after you empty it, a turkey baster can be helpful. When using a baster, squirt water up into the pouch through the opening at the bottom. With a 2-piece system, you can snap off the pouch to rinse it. After rinsing  your pouch, empty it into the toilet. When rinsing your pouch at home, put a few granules of Dreft soap in the rinse water. This helps lubricate and freshen your pouch. The inside of your pouch can be sprayed with non-stick cooking oil (Pam spray). This may help reduce stool sticking to the inside of the pouch.  Bathing You may shower or bathe with your pouch on or off. Remember that your stoma may function during this time.  The materials you use to wash your stoma and the skin around it should be clean, but they do not need to be sterile.  Wearing Your Pouch During hot weather, or if you perspire a lot in general, wear a cover over your pouch. This may prevent a rash on your skin under the pouch. Pouch covers are  sold at ostomy supply stores. Wear the pouch inside your underwear for better support. Watch your weight. Any gain or loss of 10 to 15 pounds or more can change the way your pouch fits.  Going Away From Home A collapsible cup (like those that come in travel kits) or a soft plastic squirt bottle with a pull-up top (like a travel bottle for shampoo) can be used for rinsing your pouch when you are away from home. Tilt the opening of the pouch at an upward angle when using a cup to rinse.  Carry wet wipes or extra tissues to use in public bathrooms.  Carry an extra pouching system with you at all times.  Never keep ostomy supplies in the glove compartment of your car. Extreme heat or cold can damage the skin barriers and adhesive wafers on the pouch.  When you travel, carry your ostomy supplies with you at all times. Keep them within easy reach. Do not pack ostomy supplies in baggage that will be checked or otherwise separated from you, because your baggage might be lost. If you're traveling out of the country, it is helpful to have a letter stating that you are carrying ostomy supplies as a medical necessity.  If you need ostomy supplies while traveling, look in the yellow pages of the telephone book under "Surgical Supplies." Or call the local ostomy organization to find out where supplies are available.  Do not let your ostomy supplies get low. Always order new pouches before you use the last one.  Reducing Odor Limit foods such as broccoli, cabbage, onions, fish, and garlic in your diet to help reduce odor. Each time you empty your pouch, carefully clean the opening of the pouch, both inside and outside, with toilet paper. Rinse your pouch 1 or 2 times daily after you empty it (see directions for emptying your pouch and going away from home). Add deodorant (such as Super Banish or Nullo) to your pouch. Use air deodorizers in your bathroom. Do not add aspirin to your pouch. Even though  aspirin can help prevent odor, it   could cause ulcers on your stoma.  When to call the doctor Call the doctor if you have any of the following symptoms: Purple, black, or white stoma Severe cramps lasting more than 6 hours Severe watery discharge from the stoma lasting more than 6 hours No output from the colostomy for 3 days Excessive bleeding from your stoma Swelling of your stoma to more than 1/2-inch larger than usual Pulling inward of your stoma below skin level Severe skin irritation or deep ulcers Bulging or other changes in your abdomen  When to call your ostomy nurse Call your ostomy/enterostomal therapy (WOCN) nurse if any of the following occurs: Frequent leaking of your pouching system Change in size or appearance of your stoma, causing discomfort or problems with your pouch Skin rash or rawness Weight gain or loss that causes problems with your pouch     FREQUENTLY ASKED QUESTIONS   Why haven't you met any of these folks who have an ostomy?  Well, maybe you have! You just did not recognize them because an ostomy doesn't show. It can be kept secret if you wish. Why, maybe some of your best friends, office associates or neighbors have an ostomy ... you never can tell. People facing ostomy surgery have many quality-of-life questions like: Will you bulge? Smell? Make noises? Will you feel waste leaving your body? Will you be a captive of the toilet? Will you starve? Be a social outcast? Get/stay married? Have babies? Easily bathe, go swimming, bend over?  OK, let's look at what you can expect:   Will you bulge?  Remember, without part of the intestine or bladder, and its contents, you should have a flatter tummy than before. You can expect to wear, with little exception, what you wore before surgery ... and this in-cludes tight clothing and bathing suits.   Will you smell?  Today, thanks to modern odor proof pouching systems, you can walk into an ostomy support group  meeting and not smell anything that is foul or offensive. And, for those with an ileostomy or colostomy who are concerned about odor when emptying their pouch, there are in-pouch deodorants that can be used to eliminate any waste odors that may exist.   Will you make noises?  Everyone produces gas, especially if they are an air-swallower. But intestinal sounds that occur from time to time are no differ-ent than a gurgling tummy, and quite often your clothing will muffle any sounds.   Will you feel the waste discharges?  For those with a colostomy or ileostomy there might be a slight pressure when waste leaves your body, but understand that the intestines have no nerve endings, so there will be no unpleasant sensations. Those with a urostomy will probably be unaware of any kidney drainage.   Will you be a captive of the toilet?  Immediately post-op you will spend more time in the bathroom than you will after your body recovers from surgery. Every person is different, but on average those with an ileostomy or urostomy may empty their pouches 4 to 6 times a day; a little  less if you have a colostomy. The average wear time between pouch system changes is 3 to 5 days and the changing process should take less than 30 minutes.   Will I need to be on a special diet? Most people return to their normal diet when they have recovered from surgery. Be sure to chew your food well, eat a well-balanced diet and drink plenty of fluids. If   you experience problems with a certain food, wait a couple of weeks and try it again.  Will there be odor and noises? Pouching systems are designed to be odor-proof or odor-resistant. There are deodorants that can be used in the pouch. Medications are also available to help reduce odor. Limit gas-producing foods and carbonated beverages. You will experience less gas and fewer noises as you heal from surgery.  How much time will it take to care for my ostomy? At first, you may  spend a lot of time learning about your ostomy and how to take care of it. As you become more comfortable and skilled at changing the pouching system, it will take very little time to care for it.   Will I be able to return to work? People with ostomies can perform most jobs. As soon as you have healed from surgery, you should be able to return to work. Heavy lifting (more than 10 pounds) may be discouraged.   What about intimacy? Sexual relationships and intimacy are important and fulfilling aspects of your life. They should continue after ostomy surgery. Intimacy-related concerns should be discussed openly between you and your partner.   Can I wear regular clothing? You do not need to wear special clothing. Ostomy pouches are fairly flat and barely noticeable. Elastic undergarments will not hurt the stoma or prevent the ostomy from functioning.   Can I participate in sports? An ostomy should not limit your involvement in sports. Many people with ostomies are runners, skiers, swimmers or participate in other active lifestyles. Talk with your caregiver first before doing heavy physical activity.  Will you starve?  Not if you follow doctor's orders at each stage of your post-op adjustment. There is no such thing as an "ostomy diet". Some people with an ostomy will be able to eat and tolerate anything; others may find diffi-culty with some foods. Each person is an individual and must determine, by trial, what is best for them. A good practice for all is to drink plenty of water.   Will you be a social outcast?  Have you met anyone who has an ostomy and is a social outcast? Why should you be the first? Only your attitude and self image will effect how you are treated. No confi-dent person is an outcast.    PROFESSIONAL HELP   Resources are available if you need help or have questions about your ostomy.   Specially trained nurses called Wound, Ostomy Continence Nurses (WOCN) are available for  consultation in most major medical centers.  Consider getting an ostomy consult at an outpatient ostomy clinic.   [place] has an Ostomy Clinic run by an WOCN ostomy nurse at the [facility].  [phone]. [facility] can help set up an appointment   The [employer] Ostomy Association (UOA) is a group made up of many local chapters throughout the [place]. These local groups hold meetings and provide support to prospective and existing ostomates. They sponsor educational events and have qualified visitors to make personal or telephone visits. Contact the UOA for the chapter nearest you and for other educational publications.  More detailed information can be found in Colostomy Guide, a publication of the United Ostomy Association (UOA). Contact UOA at 1-800-826-0826 or visit their web site at www.uoaa.org. The website contains links to other sites, suppliers and resources.  [employer] Secure Start Services: Start at the website to enlist for support.  Your Wound Ostomy (WOCN) nurse may have started this   process. https://www.[employer].com/en/securestart Secure Start services are designed to support people as they live their lives with an ostomy or neurogenic bladder. Enrolling is easy and at no cost to the patient. We realize that each person's needs and life journey are different. Through Secure Start services, we want to help people live their life, their way.  #######################################################  

## 2022-10-26 NOTE — Anesthesia Postprocedure Evaluation (Signed)
Anesthesia Post Note  Patient: Ashley Gonzalez  Procedure(s) Performed: XI ROBOT ASSISTED LAPAROSCOPIC PARTIAL COLECTOMY     Patient location during evaluation: PACU Anesthesia Type: General Level of consciousness: awake and alert Pain management: pain level controlled Vital Signs Assessment: post-procedure vital signs reviewed and stable Respiratory status: spontaneous breathing, nonlabored ventilation, respiratory function stable and patient connected to nasal cannula oxygen Cardiovascular status: blood pressure returned to baseline and stable Postop Assessment: no apparent nausea or vomiting Anesthetic complications: no  No notable events documented.  Last Vitals:  Vitals:   10/26/22 1034 10/26/22 1150  BP: 133/68 134/66  Pulse: 67 67  Resp: 18 17  Temp: (!) 36.4 C 36.4 C  SpO2: 100% 100%    Last Pain:  Vitals:   10/26/22 1150  TempSrc: Oral  PainSc:                  Djuna Frechette

## 2022-10-27 LAB — CBC
HCT: 33.6 % — ABNORMAL LOW (ref 36.0–46.0)
Hemoglobin: 11.2 g/dL — ABNORMAL LOW (ref 12.0–15.0)
MCH: 29.3 pg (ref 26.0–34.0)
MCHC: 33.3 g/dL (ref 30.0–36.0)
MCV: 88 fL (ref 80.0–100.0)
Platelets: 274 10*3/uL (ref 150–400)
RBC: 3.82 MIL/uL — ABNORMAL LOW (ref 3.87–5.11)
RDW: 13.1 % (ref 11.5–15.5)
WBC: 8.8 10*3/uL (ref 4.0–10.5)
nRBC: 0 % (ref 0.0–0.2)

## 2022-10-27 LAB — BASIC METABOLIC PANEL
Anion gap: 6 (ref 5–15)
BUN: 7 mg/dL — ABNORMAL LOW (ref 8–23)
CO2: 23 mmol/L (ref 22–32)
Calcium: 8.7 mg/dL — ABNORMAL LOW (ref 8.9–10.3)
Chloride: 101 mmol/L (ref 98–111)
Creatinine, Ser: 0.51 mg/dL (ref 0.44–1.00)
GFR, Estimated: >60 mL/min (ref >60)
Glucose, Bld: 132 mg/dL — ABNORMAL HIGH (ref 70–99)
Potassium: 4.1 mmol/L (ref 3.5–5.1)
Sodium: 130 mmol/L — ABNORMAL LOW (ref 135–145)

## 2022-10-27 LAB — SURGICAL PATHOLOGY

## 2022-10-27 MED ORDER — TRAMADOL HCL 50 MG PO TABS
50.0000 mg | ORAL_TABLET | Freq: Four times a day (QID) | ORAL | Status: DC | PRN
  Filled 2022-10-27: qty 1

## 2022-10-27 MED ORDER — SODIUM CHLORIDE 0.9 % IV SOLN: INTRAVENOUS | Status: AC

## 2022-10-27 NOTE — TOC CM/SW Note (Signed)
Transition of Care [facility]) Screening Note  Patient Details  Name: Ashley Gonzalez Date of Birth: [birth year]  Transition of [facility]) CM/SW Contact:    Sherie Don, LCSW Phone Number: 10/27/2022, 8:40 AM  Transition of Care Department [facility]) has reviewed patient and no TOC needs have been identified at this time. We will continue to monitor patient advancement through interdisciplinary progression rounds. If new patient transition needs arise, please place a TOC consult.

## 2022-10-27 NOTE — Progress Notes (Signed)
Mobility Specialist - Progress Note   10/27/22 1356  Mobility  Activity Ambulated with assistance in hallway;Ambulated with assistance to bathroom  Level of Assistance Standby assist, set-up cues, supervision of patient - no hands on  Assistive Device Other (Comment) (IV Pole)  Distance Ambulated (ft) 500 ft  Activity Response Tolerated well  Mobility Referral Yes  $Mobility charge 1 Mobility   Pt received in bed and agreeable to mobility. No complaints during session. Pt to bathroom after session w/ all needs met. NT made aware.   [facility]

## 2022-10-27 NOTE — Progress Notes (Signed)
1 Day Post-Op Robotic R colectomy  Subjective: No nausea, min pain, tolerating liquids, having bowel function, feels dehydrated  Objective: Vital signs in last 24 hours: Temp:  [97.4 F (36.3 C)-98.8 F (37.1 C)] 97.6 F (36.4 C) (03/28 0548) Pulse Rate:  [59-75] 72 (03/28 0548) Resp:  [16-23] 18 (03/28 0548) BP: (105-138)/(59-68) 118/64 (03/28 0548) SpO2:  [99 %-100 %] 100 % (03/28 0548) Weight:  [50.6 kg] 50.6 kg (03/28 0500)   Intake/Output from previous day: 03/27 0701 - 03/28 0700 In: 2855.1 [P.O.:780; I.V.:1975.1; IV Piggyback:100] Out: 2420 [Urine:2220; Blood:50] Intake/Output this shift: No intake/output data recorded.   General appearance: alert and cooperative GI: normal findings: soft, non-tender  Incision: no significant drainage  Lab Results:  Recent Labs    10/27/22 0508  WBC 8.8  HGB 11.2*  HCT 33.6*  PLT 274   BMET Recent Labs    10/27/22 0508  NA 130*  K 4.1  CL 101  CO2 23  GLUCOSE 132*  BUN 7*  CREATININE 0.51  CALCIUM 8.7*   PT/INR No results for input(s): "LABPROT", "INR" in the last 72 hours. ABG No results for input(s): "PHART", "HCO3" in the last 72 hours.  Invalid input(s): "PCO2", "PO2"  MEDS, Scheduled  acetaminophen  1,000 mg Oral Q6H   alvimopan  12 mg Oral BID   enoxaparin (LOVENOX) injection  40 mg Subcutaneous Q24H   feeding supplement  237 mL Oral BID BM   gabapentin  300 mg Oral BID   rosuvastatin  20 mg Oral Daily   saccharomyces boulardii  250 mg Oral BID    Studies/Results: No results found.  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

## 2022-10-28 LAB — CBC
HCT: 33 % — ABNORMAL LOW (ref 36.0–46.0)
Hemoglobin: 11.3 g/dL — ABNORMAL LOW (ref 12.0–15.0)
MCH: 29.8 pg (ref 26.0–34.0)
MCHC: 34.2 g/dL (ref 30.0–36.0)
MCV: 87.1 fL (ref 80.0–100.0)
Platelets: 280 10*3/uL (ref 150–400)
RBC: 3.79 MIL/uL — ABNORMAL LOW (ref 3.87–5.11)
RDW: 13.1 % (ref 11.5–15.5)
WBC: 6.3 10*3/uL (ref 4.0–10.5)
nRBC: 0 % (ref 0.0–0.2)

## 2022-10-28 LAB — BASIC METABOLIC PANEL
Anion gap: 7 (ref 5–15)
BUN: 6 mg/dL — ABNORMAL LOW (ref 8–23)
CO2: 21 mmol/L — ABNORMAL LOW (ref 22–32)
Calcium: 8.8 mg/dL — ABNORMAL LOW (ref 8.9–10.3)
Chloride: 98 mmol/L (ref 98–111)
Creatinine, Ser: 0.43 mg/dL — ABNORMAL LOW (ref 0.44–1.00)
GFR, Estimated: >60 mL/min (ref >60)
Glucose, Bld: 109 mg/dL — ABNORMAL HIGH (ref 70–99)
Potassium: 3.7 mmol/L (ref 3.5–5.1)
Sodium: 126 mmol/L — ABNORMAL LOW (ref 135–145)

## 2022-10-28 MED ORDER — TRAMADOL HCL 50 MG PO TABS: 50.0000 mg | ORAL_TABLET | Freq: Four times a day (QID) | ORAL | 0 refills | Status: AC | PRN

## 2022-10-28 NOTE — Progress Notes (Signed)
Discussed AVS with patient and daughter both verbalizing understanding. Pt discharged safely with family and all bedside belongings

## 2022-10-28 NOTE — Plan of Care (Signed)
Problem: Education: Goal: Understanding of discharge needs will improve 10/28/2022 1256 by Lorin Picket, RN Outcome: Completed/Met 10/28/2022 1255 by Lorin Picket, RN Outcome: Adequate for Discharge Goal: Verbalization of understanding of the causes of altered bowel function will improve 10/28/2022 1256 by Lorin Picket, RN Outcome: Completed/Met 10/28/2022 1255 by Lorin Picket, RN Outcome: Adequate for Discharge   Problem: Activity: Goal: Ability to tolerate increased activity will improve 10/28/2022 1256 by Lorin Picket, RN Outcome: Completed/Met 10/28/2022 1255 by Lorin Picket, RN Outcome: Adequate for Discharge   Problem: Bowel/Gastric: Goal: Gastrointestinal status for postoperative course will improve 10/28/2022 1256 by Lorin Picket, RN Outcome: Completed/Met 10/28/2022 1255 by Lorin Picket, RN Outcome: Adequate for Discharge   Problem: Health Behavior/Discharge Planning: Goal: Identification of community resources to assist with postoperative recovery needs will improve 10/28/2022 1256 by Lorin Picket, RN Outcome: Completed/Met 10/28/2022 1255 by Lorin Picket, RN Outcome: Adequate for Discharge   Problem: Nutritional: Goal: Will attain and maintain optimal nutritional status will improve 10/28/2022 1256 by Lorin Picket, RN Outcome: Completed/Met 10/28/2022 1255 by Lorin Picket, RN Outcome: Adequate for Discharge   Problem: Clinical Measurements: Goal: Postoperative complications will be avoided or minimized 10/28/2022 1256 by Lorin Picket, RN Outcome: Completed/Met 10/28/2022 1255 by Lorin Picket, RN Outcome: Adequate for Discharge   Problem: Respiratory: Goal: Respiratory status will improve 10/28/2022 1256 by Lorin Picket, RN Outcome: Completed/Met 10/28/2022 1255 by Lorin Picket, RN Outcome: Adequate for Discharge   Problem: Skin Integrity: Goal: Will show signs of wound healing 10/28/2022 1256 by  Lorin Picket, RN Outcome: Completed/Met 10/28/2022 1255 by Lorin Picket, RN Outcome: Adequate for Discharge   Problem: Education: Goal: Knowledge of General Education information will improve Description: Including pain rating scale, medication(s)/side effects and non-pharmacologic comfort measures 10/28/2022 1256 by Lorin Picket, RN Outcome: Completed/Met 10/28/2022 1255 by Lorin Picket, RN Outcome: Adequate for Discharge   Problem: Health Behavior/Discharge Planning: Goal: Ability to manage health-related needs will improve 10/28/2022 1256 by Lorin Picket, RN Outcome: Completed/Met 10/28/2022 1255 by Lorin Picket, RN Outcome: Adequate for Discharge   Problem: Clinical Measurements: Goal: Ability to maintain clinical measurements within normal limits will improve 10/28/2022 1256 by Lorin Picket, RN Outcome: Completed/Met 10/28/2022 1255 by Lorin Picket, RN Outcome: Adequate for Discharge Goal: Will remain free from infection 10/28/2022 1256 by Lorin Picket, RN Outcome: Completed/Met 10/28/2022 1255 by Lorin Picket, RN Outcome: Adequate for Discharge Goal: Diagnostic test results will improve 10/28/2022 1256 by Lorin Picket, RN Outcome: Completed/Met 10/28/2022 1255 by Lorin Picket, RN Outcome: Adequate for Discharge Goal: Respiratory complications will improve 10/28/2022 1256 by Lorin Picket, RN Outcome: Completed/Met 10/28/2022 1255 by Lorin Picket, RN Outcome: Adequate for Discharge Goal: Cardiovascular complication will be avoided 10/28/2022 1256 by Lorin Picket, RN Outcome: Completed/Met 10/28/2022 1255 by Lorin Picket, RN Outcome: Adequate for Discharge   Problem: Activity: Goal: Risk for activity intolerance will decrease 10/28/2022 1256 by Lorin Picket, RN Outcome: Completed/Met 10/28/2022 1255 by Lorin Picket, RN Outcome: Adequate for Discharge   Problem: Nutrition: Goal: Adequate nutrition will be  maintained 10/28/2022 1256 by Lorin Picket, RN Outcome: Completed/Met 10/28/2022 1255 by Lorin Picket, RN Outcome: Adequate for Discharge   Problem: Coping: Goal: Level of anxiety will decrease 10/28/2022 1256 by Lorin Picket, RN Outcome: Completed/Met 10/28/2022 1255 by Lorin Picket, RN Outcome: Adequate  for Discharge   Problem: Elimination: Goal: Will not experience complications related to bowel motility 10/28/2022 1256 by Lorin Picket, RN Outcome: Completed/Met 10/28/2022 1255 by Lorin Picket, RN Outcome: Adequate for Discharge Goal: Will not experience complications related to urinary retention 10/28/2022 1256 by Lorin Picket, RN Outcome: Completed/Met 10/28/2022 1255 by Lorin Picket, RN Outcome: Adequate for Discharge   Problem: Pain Managment: Goal: General experience of comfort will improve 10/28/2022 1256 by Lorin Picket, RN Outcome: Completed/Met 10/28/2022 1255 by Lorin Picket, RN Outcome: Adequate for Discharge   Problem: Safety: Goal: Ability to remain free from injury will improve 10/28/2022 1256 by Lorin Picket, RN Outcome: Completed/Met 10/28/2022 1255 by Lorin Picket, RN Outcome: Adequate for Discharge   Problem: Skin Integrity: Goal: Risk for impaired skin integrity will decrease 10/28/2022 1256 by Lorin Picket, RN Outcome: Completed/Met 10/28/2022 1255 by Lorin Picket, RN Outcome: Adequate for Discharge

## 2022-10-28 NOTE — Progress Notes (Signed)
Pharmacy Brief Note - Alvimopan (Entereg)  The standing order set for alvimopan (Entereg) now includes an automatic order to discontinue the drug after the patient has had a bowel movement. The change was approved by the [facility] and the Medical Executive Committee.   This patient has had bowel movements documented by nursing. Therefore, alvimopan has been discontinued. If there are questions, please contact the pharmacy at [phone].   Thank you-    Royetta Asal, PharmD, BCPS 10/28/2022 7:48 AM

## 2022-10-28 NOTE — Discharge Summary (Signed)
Physician Discharge Summary  Patient ID: Ashley Gonzalez MRN: [id number] DOB/AGE: [birth year] 77 y.o.  Admit date: 10/26/2022 Discharge date: 10/28/2022  Admission Diagnoses: Colon polyp  Discharge Diagnoses: Status post right colectomy Principal Problem:   Colon polyp   Discharged Condition: good  Hospital Course: Patient was admitted postoperatively.  Please see operative note for full details. Patient otherwise was transition from a liquid diet to a regular diet which she was able to tolerate well.  She had good pain control.  Patient had good bowel function.  Patient was otherwise ambulating well on her own DVM for discharge and discharged home.  Consults: None  Significant Diagnostic Studies: None  Treatments: surgery: As above  Discharge Exam: Blood pressure 126/70, pulse 65, temperature 98.6 F (37 C), temperature source Oral, resp. rate 16, height 5\' 1"  (1.549 m), weight 52 kg, SpO2 98 %. General appearance: alert and cooperative GI: soft, non-tender; bowel sounds normal; no masses,  no organomegaly and incisions clean dry and intact.  Disposition: Discharge disposition: 01-Home or Self Care       Discharge Instructions     Diet - low sodium heart healthy   Complete by: As directed    Increase activity slowly   Complete by: As directed       Allergies as of 10/28/2022   No Known Allergies      Medication List     TAKE these medications    alendronate 70 MG tablet Commonly known as: FOSAMAX Take 70 mg by mouth every Friday. Take with a full glass of water on an empty stomach.   Calcium+D3 600-20 MG-MCG Tabs Generic drug: Calcium Carb-Cholecalciferol Take 1 tablet by mouth in the morning and at bedtime.   ferrous sulfate 325 (65 FE) MG tablet Take 325 mg by mouth daily with breakfast.   multivitamin tablet Take 1 tablet by mouth daily.   naproxen sodium 220 MG tablet Commonly known as: ALEVE Take 220-440 mg by mouth daily as needed (Pain).    rosuvastatin 20 MG tablet Commonly known as: CRESTOR Take 20 mg by mouth daily.   traMADol 50 MG tablet Commonly known as: ULTRAM Take 1 tablet (50 mg total) by mouth every 6 (six) hours as needed for moderate pain or severe pain.        Follow-up Information     Leighton Ruff, MD. Schedule an appointment as soon as possible for a visit in 2 week(s).   Specialties: General Surgery, Colon and Rectal Surgery Contact information: [address] [place] [id number] [phone]                 Signed: Ralene Ok 10/28/2022, 8:59 AM

## 2022-10-28 NOTE — Plan of Care (Signed)

## 2022-11-03 DIAGNOSIS — R112 Nausea with vomiting, unspecified: Secondary | ICD-10-CM | POA: Diagnosis not present

## 2022-11-08 ENCOUNTER — Other Ambulatory Visit (HOSPITAL_COMMUNITY): Payer: Self-pay | Admitting: General Surgery

## 2022-11-08 ENCOUNTER — Telehealth: Payer: Self-pay | Admitting: General Surgery

## 2022-11-08 ENCOUNTER — Ambulatory Visit (HOSPITAL_BASED_OUTPATIENT_CLINIC_OR_DEPARTMENT_OTHER)
Admission: RE | Admit: 2022-11-08 | Discharge: 2022-11-08 | Disposition: A | Discharge status: Home / Self Care | Payer: No Typology Code available for payment source | Source: Ambulatory Visit | Attending: General Surgery | Admitting: General Surgery

## 2022-11-08 DIAGNOSIS — R112 Nausea with vomiting, unspecified: Secondary | ICD-10-CM

## 2022-11-08 DIAGNOSIS — K6389 Other specified diseases of intestine: Secondary | ICD-10-CM | POA: Diagnosis not present

## 2022-11-08 DIAGNOSIS — R69 Illness, unspecified: Secondary | ICD-10-CM | POA: Diagnosis not present

## 2022-11-08 MED ORDER — IOHEXOL 300 MG/ML  SOLN
100.0000 mL | Freq: Once | INTRAMUSCULAR | Status: AC | PRN
  Administered 2022-11-08: 100 mL via INTRAVENOUS

## 2022-11-08 NOTE — Telephone Encounter (Signed)
I was called about an outpatient CT scan. Natalyah is 2 weeks out from robotic right hemicolectomy for polyp. She was having worse nausea and diarrhea the last couple days and underwent CT scan earlier today. The radiologist noted marked small intestine wall thickening and vasc congestion. No leak and contrast passed into the colon. I spoke to Jensy who is doing well with no pain and noted some additional diarrhea after she made it home from the CT, likely the passed contrast. I discussed the CT findings with Pasty and recommended continued fluids. She was thankful for the call.

## 2022-11-16 DIAGNOSIS — R69 Illness, unspecified: Secondary | ICD-10-CM | POA: Diagnosis not present

## 2022-11-20 ENCOUNTER — Other Ambulatory Visit: Payer: Self-pay

## 2022-11-20 ENCOUNTER — Emergency Department (HOSPITAL_COMMUNITY): Payer: No Typology Code available for payment source

## 2022-11-20 ENCOUNTER — Inpatient Hospital Stay (HOSPITAL_COMMUNITY)
Admission: EM | Admit: 2022-11-20 | Discharge: 2022-11-25 | DRG: 389 | Disposition: A | Discharge status: Home Health | Payer: No Typology Code available for payment source | Attending: General Surgery | Admitting: General Surgery

## 2022-11-20 DIAGNOSIS — Z9851 Tubal ligation status: Secondary | ICD-10-CM | POA: Diagnosis not present

## 2022-11-20 DIAGNOSIS — E162 Hypoglycemia, unspecified: Secondary | ICD-10-CM | POA: Diagnosis present

## 2022-11-20 DIAGNOSIS — K3189 Other diseases of stomach and duodenum: Secondary | ICD-10-CM | POA: Diagnosis not present

## 2022-11-20 DIAGNOSIS — N289 Disorder of kidney and ureter, unspecified: Secondary | ICD-10-CM | POA: Diagnosis not present

## 2022-11-20 DIAGNOSIS — Z9049 Acquired absence of other specified parts of digestive tract: Secondary | ICD-10-CM | POA: Diagnosis not present

## 2022-11-20 DIAGNOSIS — Z79899 Other long term (current) drug therapy: Secondary | ICD-10-CM

## 2022-11-20 DIAGNOSIS — Z888 Allergy status to other drugs, medicaments and biological substances status: Secondary | ICD-10-CM | POA: Diagnosis not present

## 2022-11-20 DIAGNOSIS — Z7983 Long term (current) use of bisphosphonates: Secondary | ICD-10-CM | POA: Diagnosis not present

## 2022-11-20 DIAGNOSIS — K56609 Unspecified intestinal obstruction, unspecified as to partial versus complete obstruction: Secondary | ICD-10-CM | POA: Diagnosis present

## 2022-11-20 DIAGNOSIS — K567 Ileus, unspecified: Principal | ICD-10-CM | POA: Diagnosis present

## 2022-11-20 DIAGNOSIS — K559 Vascular disorder of intestine, unspecified: Secondary | ICD-10-CM | POA: Diagnosis not present

## 2022-11-20 DIAGNOSIS — Z83719 Family history of colon polyps, unspecified: Secondary | ICD-10-CM

## 2022-11-20 DIAGNOSIS — Z86010 Personal history of colonic polyps: Secondary | ICD-10-CM

## 2022-11-20 DIAGNOSIS — E785 Hyperlipidemia, unspecified: Secondary | ICD-10-CM | POA: Diagnosis present

## 2022-11-20 DIAGNOSIS — K9189 Other postprocedural complications and disorders of digestive system: Secondary | ICD-10-CM | POA: Diagnosis not present

## 2022-11-20 DIAGNOSIS — R112 Nausea with vomiting, unspecified: Principal | ICD-10-CM

## 2022-11-20 DIAGNOSIS — E11649 Type 2 diabetes mellitus with hypoglycemia without coma: Secondary | ICD-10-CM | POA: Diagnosis not present

## 2022-11-20 DIAGNOSIS — M1990 Unspecified osteoarthritis, unspecified site: Secondary | ICD-10-CM

## 2022-11-20 DIAGNOSIS — K529 Noninfective gastroenteritis and colitis, unspecified: Secondary | ICD-10-CM | POA: Diagnosis not present

## 2022-11-20 DIAGNOSIS — Z8719 Personal history of other diseases of the digestive system: Secondary | ICD-10-CM | POA: Diagnosis not present

## 2022-11-20 LAB — CBC WITH DIFFERENTIAL/PLATELET
Abs Immature Granulocytes: 0.06 10*3/uL (ref 0.00–0.07)
Basophils Absolute: 0 10*3/uL (ref 0.0–0.1)
Basophils Relative: 0 %
Eosinophils Absolute: 0.1 10*3/uL (ref 0.0–0.5)
Eosinophils Relative: 1 %
HCT: 37.8 % (ref 36.0–46.0)
Hemoglobin: 12.3 g/dL (ref 12.0–15.0)
Immature Granulocytes: 1 %
Lymphocytes Relative: 11 %
Lymphs Abs: 1.2 10*3/uL (ref 0.7–4.0)
MCH: 29.1 pg (ref 26.0–34.0)
MCHC: 32.5 g/dL (ref 30.0–36.0)
MCV: 89.6 fL (ref 80.0–100.0)
Monocytes Absolute: 0.7 10*3/uL (ref 0.1–1.0)
Monocytes Relative: 6 %
Neutro Abs: 9.1 10*3/uL — ABNORMAL HIGH (ref 1.7–7.7)
Neutrophils Relative %: 81 %
Platelets: 426 10*3/uL — ABNORMAL HIGH (ref 150–400)
RBC: 4.22 MIL/uL (ref 3.87–5.11)
RDW: 13.1 % (ref 11.5–15.5)
WBC: 11.2 10*3/uL — ABNORMAL HIGH (ref 4.0–10.5)
nRBC: 0 % (ref 0.0–0.2)

## 2022-11-20 LAB — COMPREHENSIVE METABOLIC PANEL
ALT: 15 U/L (ref 0–44)
AST: 25 U/L (ref 15–41)
Albumin: 3.5 g/dL (ref 3.5–5.0)
Alkaline Phosphatase: 53 U/L (ref 38–126)
Anion gap: 23 — ABNORMAL HIGH (ref 5–15)
BUN: 10 mg/dL (ref 8–23)
CO2: 20 mmol/L — ABNORMAL LOW (ref 22–32)
Calcium: 9.4 mg/dL (ref 8.9–10.3)
Chloride: 90 mmol/L — ABNORMAL LOW (ref 98–111)
Creatinine, Ser: 0.67 mg/dL (ref 0.44–1.00)
GFR, Estimated: >60 mL/min (ref >60)
Glucose, Bld: 67 mg/dL — ABNORMAL LOW (ref 70–99)
Potassium: 3.2 mmol/L — ABNORMAL LOW (ref 3.5–5.1)
Sodium: 133 mmol/L — ABNORMAL LOW (ref 135–145)
Total Bilirubin: 1.3 mg/dL — ABNORMAL HIGH (ref 0.3–1.2)
Total Protein: 6.8 g/dL (ref 6.5–8.1)

## 2022-11-20 LAB — CBG MONITORING, ED
Glucose-Capillary: 56 mg/dL — ABNORMAL LOW (ref 70–99)
Glucose-Capillary: 80 mg/dL (ref 70–99)

## 2022-11-20 MED ORDER — ENOXAPARIN SODIUM 40 MG/0.4ML IJ SOSY
40.0000 mg | PREFILLED_SYRINGE | Freq: Every day | INTRAMUSCULAR | Status: DC
  Administered 2022-11-20 – 2022-11-24 (×5): 40 mg via SUBCUTANEOUS
  Filled 2022-11-20 (×5): qty 0.4

## 2022-11-20 MED ORDER — IOHEXOL 300 MG/ML  SOLN
100.0000 mL | Freq: Once | INTRAMUSCULAR | Status: AC | PRN
  Administered 2022-11-20: 100 mL via INTRAVENOUS

## 2022-11-20 MED ORDER — MIDAZOLAM HCL 2 MG/2ML IJ SOLN
2.0000 mg | Freq: Once | INTRAMUSCULAR | Status: AC
  Administered 2022-11-20: 2 mg via INTRAVENOUS
  Filled 2022-11-20: qty 2

## 2022-11-20 MED ORDER — ONDANSETRON HCL 4 MG/2ML IJ SOLN
4.0000 mg | Freq: Once | INTRAMUSCULAR | Status: AC
  Administered 2022-11-20: 4 mg via INTRAVENOUS
  Filled 2022-11-20: qty 2

## 2022-11-20 MED ORDER — SODIUM CHLORIDE 0.9 % IV BOLUS
1000.0000 mL | Freq: Once | INTRAVENOUS | Status: AC
  Administered 2022-11-20: 1000 mL via INTRAVENOUS

## 2022-11-20 MED ORDER — KCL IN DEXTROSE-NACL 40-5-0.9 MEQ/L-%-% IV SOLN
INTRAVENOUS | Status: DC
  Filled 2022-11-20 (×8): qty 1000

## 2022-11-20 MED ORDER — ONDANSETRON HCL 4 MG/2ML IJ SOLN: 4.0000 mg | Freq: Four times a day (QID) | INTRAMUSCULAR | Status: DC | PRN

## 2022-11-20 MED ORDER — ONDANSETRON 4 MG PO TBDP: 4.0000 mg | ORAL_TABLET | Freq: Four times a day (QID) | ORAL | Status: DC | PRN

## 2022-11-20 MED ORDER — IOHEXOL 300 MG/ML  SOLN: 100.0000 mL | Freq: Once | INTRAMUSCULAR | Status: DC | PRN

## 2022-11-20 MED ORDER — POTASSIUM CHLORIDE 10 MEQ/100ML IV SOLN
10.0000 meq | INTRAVENOUS | Status: AC
  Administered 2022-11-20 (×2): 10 meq via INTRAVENOUS
  Filled 2022-11-20 (×2): qty 100

## 2022-11-20 NOTE — ED Notes (Signed)
ED TO INPATIENT HANDOFF REPORT  Name/Age/Gender Ashley Gonzalez 77 y.o. female  Code Status    Code Status Orders  (From admission, onward)           Start     Ordered   11/20/22 1653  Full code  Continuous       Question:  By:  Answer:  Consent: discussion documented in EHR   11/20/22 1654           Code Status History     Date Active Date Inactive Code Status Order ID Comments User Context   10/26/2022 1038 10/28/2022 1832 Full Code [id number]  Romie Levee, MD Inpatient       Home/SNF/Other Home  Chief Complaint SBO (small bowel obstruction) [K56.609]  Level of Care/Admitting Diagnosis ED Disposition     ED Disposition  Admit   Condition  --   Comment  Hospital Area: [facility] [[id number]]  Level of Care: Med-Surg [16]  May admit patient to [facility] or [facility] if equivalent level of care is available:: No  Covid Evaluation: Asymptomatic - no recent exposure (last 10 days) testing not required  Diagnosis: SBO (small bowel obstruction) [469629]  Admitting Physician: Axel Filler [phone]  Attending Physician: Romie Levee [phone]  Certification:: I certify this patient will need inpatient services for at least 2 midnights  Estimated Length of Stay: 3          Medical History Past Medical History:  Diagnosis Date   Anemia    Arthritis    Hyperlipidemia    Tubular adenoma of colon     Allergies No Known Allergies  IV Location/Drains/Wounds Patient Lines/Drains/Airways Status     Active Line/Drains/Airways     Name Placement date Placement time Site Days   Peripheral IV 11/20/22 22 G 1" Anterior;Left Forearm 11/20/22  1128  Forearm  less than 1   NG/OG Vented/Dual Lumen 16 Fr. Right nare Marking at nare/corner of mouth 55 cm 11/20/22  1654  Right nare  less than 1   Incision - 4 Ports Abdomen 1: Right 2: Lower;Left 3: Left;Mid 4: Upper;Left 10/26/22  0921  -- 25   Incision - 1 Port Abdomen 1: Left;Mid;Upper  10/26/22  --  -- 25            Labs/Imaging Results for orders placed or performed during the hospital encounter of 11/20/22 (from the past 48 hour(s))  Comprehensive metabolic panel     Status: Abnormal   Collection Time: 11/20/22 11:38 AM  Result Value Ref Range   Sodium 133 (L) 135 - 145 mmol/L    Comment: ELECTROLYTES REPEATED TO VERIFY   Potassium 3.2 (L) 3.5 - 5.1 mmol/L   Chloride 90 (L) 98 - 111 mmol/L    Comment: ELECTROLYTES REPEATED TO VERIFY   CO2 20 (L) 22 - 32 mmol/L    Comment: ELECTROLYTES REPEATED TO VERIFY   Glucose, Bld 67 (L) 70 - 99 mg/dL    Comment: Glucose reference range applies only to samples taken after fasting for at least 8 hours.   BUN 10 8 - 23 mg/dL   Creatinine, Ser 4.01 0.44 - 1.00 mg/dL   Calcium 9.4 8.9 - 02.7 mg/dL   Total Protein 6.8 6.5 - 8.1 g/dL   Albumin 3.5 3.5 - 5.0 g/dL   AST 25 15 - 41 U/L   ALT 15 0 - 44 U/L   Alkaline Phosphatase 53 38 - 126 U/L   Total Bilirubin 1.3 (H)  0.3 - 1.2 mg/dL   GFR, Estimated >02 >72 mL/min    Comment: (NOTE) Calculated using the CKD-EPI Creatinine Equation (2021)    Anion gap 23 (H) 5 - 15    Comment: ELECTROLYTES REPEATED TO VERIFY Performed at [facility], [address] [place], [place] [address]   CBC with Differential     Status: Abnormal   Collection Time: 11/20/22 11:38 AM  Result Value Ref Range   WBC 11.2 (H) 4.0 - 10.5 K/uL   RBC 4.22 3.87 - 5.11 MIL/uL   Hemoglobin 12.3 12.0 - 15.0 g/dL   HCT 40.3 47.4 - 25.9 %   MCV 89.6 80.0 - 100.0 fL   MCH 29.1 26.0 - 34.0 pg   MCHC 32.5 30.0 - 36.0 g/dL   RDW 56.3 87.5 - 64.3 %   Platelets 426 (H) 150 - 400 K/uL   nRBC 0.0 0.0 - 0.2 %   Neutrophils Relative % 81 %   Neutro Abs 9.1 (H) 1.7 - 7.7 K/uL   Lymphocytes Relative 11 %   Lymphs Abs 1.2 0.7 - 4.0 K/uL   Monocytes Relative 6 %   Monocytes Absolute 0.7 0.1 - 1.0 K/uL   Eosinophils Relative 1 %   Eosinophils Absolute 0.1 0.0 - 0.5 K/uL   Basophils Relative 0  %   Basophils Absolute 0.0 0.0 - 0.1 K/uL   Immature Granulocytes 1 %   Abs Immature Granulocytes 0.06 0.00 - 0.07 K/uL    Comment: Performed at [facility] [address], [place], [place] [address]  CBG monitoring, ED     Status: Abnormal   Collection Time: 11/20/22  2:24 PM  Result Value Ref Range   Glucose-Capillary 56 (L) 70 - 99 mg/dL    Comment: Glucose reference range applies only to samples taken after fasting for at least 8 hours.  CBG monitoring, ED     Status: None   Collection Time: 11/20/22  3:08 PM  Result Value Ref Range   Glucose-Capillary 80 70 - 99 mg/dL    Comment: Glucose reference range applies only to samples taken after fasting for at least 8 hours.   CT ABDOMEN PELVIS W CONTRAST  Result Date: 11/20/2022 CLINICAL DATA:  Concern for bowel obstruction. EXAM: CT ABDOMEN AND PELVIS WITH CONTRAST TECHNIQUE: Multidetector CT imaging of the abdomen and pelvis was performed using the standard protocol following bolus administration of intravenous contrast. RADIATION DOSE REDUCTION: This exam was performed according to the departmental dose-optimization program which includes automated exposure control, adjustment of the mA and/or kV according to patient size and/or use of iterative reconstruction technique. CONTRAST:  OMNIPAQUE IOHEXOL 300 MG/ML  SOLN COMPARISON:  Same day abdominal radiograph and CT abdomen pelvis dated 11/08/2022. FINDINGS: Lower chest: No acute abnormality. Hepatobiliary: No suspicious focal liver abnormality is seen. No gallstones, gallbladder wall thickening, or biliary dilatation. Pancreas: Unremarkable. No pancreatic ductal dilatation or surrounding inflammatory changes. Spleen: Normal in size without focal abnormality. Adrenals/Urinary Tract: Adrenal glands are unremarkable. Kidneys are normal, without renal calculi, suspicious focal lesion, or hydronephrosis. Bladder is unremarkable. Stomach/Bowel: Stomach is within normal limits.  The patient is status post a partial colectomy with ileocolic anastomosis in the right hemiabdomen. Several dilated loops of small bowel with bowel wall thickening and mucosal hyperenhancement are seen in the lower and mid abdomen, similar to 11/08/2022. There is no definite discrete transition point from dilated bowel to decompressed distal bowel. Vascular/Lymphatic: No significant vascular findings are present. No enlarged  abdominal or pelvic lymph nodes. Reproductive: At least one uterine fibroid is noted. No suspicious adnexal mass. Other: No abdominal wall hernia. Small volume free intraperitoneal fluid. Musculoskeletal: Degenerative changes are seen in the spine. IMPRESSION: Several dilated loops of small bowel with bowel wall thickening and mucosal hyperenhancement are seen in the lower and mid abdomen. These findings may reflect an infectious/inflammatory process and some degree of bowel obstruction. No definite transition point to suggest high-grade bowel obstruction. Electronically Signed   By: Romona Curls M.D.   On: 11/20/2022 15:50   DG Abd 2 Views  Result Date: 11/20/2022 CLINICAL DATA:  Nausea and vomiting for several days, history of prior partial colectomy initial encounter EXAM: ABDOMEN - 2 VIEW COMPARISON:  11/08/2022 CT FINDINGS: Scattered large and small bowel gas is noted. Mildly prominent small bowel loops are noted in the central abdomen. This may represent early small bowel obstruction. No free air is noted. Postsurgical changes are noted in the right abdomen consistent with the given clinical history. No acute bony abnormality is noted. IMPRESSION: Mildly prominent small bowel loops. CT may be helpful for further evaluation. Electronically Signed   By: Alcide Clever M.D.   On: 11/20/2022 11:04    Pending Labs Unresulted Labs (From admission, onward)     Start     Ordered   11/21/22 0500  Basic metabolic panel  Tomorrow morning,   R        11/20/22 1654   11/21/22 0500  CBC   Tomorrow morning,   R        11/20/22 1654            Vitals/Pain Today's Vitals   11/20/22 1330 11/20/22 1400 11/20/22 1430 11/20/22 1500  BP: 123/64  132/65 (!) 141/70  Pulse: 81  95 95  Resp:   17   Temp:  97.7 F (36.5 C)    TempSrc:      SpO2: 100%  99% 100%  Weight:      PainSc:        Isolation Precautions No active isolations  Medications Medications  enoxaparin (LOVENOX) injection 40 mg (has no administration in time range)  dextrose 5 % and 0.9 % NaCl with KCl 40 mEq/L infusion (has no administration in time range)  ondansetron (ZOFRAN-ODT) disintegrating tablet 4 mg (has no administration in time range)    Or  ondansetron (ZOFRAN) injection 4 mg (has no administration in time range)  potassium chloride 10 mEq in 100 mL IVPB (has no administration in time range)  ondansetron (ZOFRAN) injection 4 mg (4 mg Intravenous Given 11/20/22 1130)  sodium chloride 0.9 % bolus 1,000 mL (0 mLs Intravenous Stopped 11/20/22 1235)  iohexol (OMNIPAQUE) 300 MG/ML solution 100 mL (100 mLs Intravenous Contrast Given 11/20/22 1518)  midazolam (VERSED) injection 2 mg (2 mg Intravenous Given 11/20/22 1628)    Mobility walks

## 2022-11-20 NOTE — ED Triage Notes (Addendum)
C/o n/v  with abd bloating x3 days.  Bland diet then clear liquid diet w/o relief Robotic laparoscopic partial colectomy on 3/27 Denies abd pain.  Normal BM

## 2022-11-20 NOTE — H&P (Signed)
Ashley Gonzalez is an 77 y.o. female.   Chief Complaint: Abdominal pain, nausea vomiting HPI: Patient is a 77 year old female with status post robotic right colectomy for cecal polyp.  Patient currently postop day #25.  According to patient's daughter-in-law patient had some abdominal pain, nausea some emesis over the last 2 days.  Patient states that she states some abdominal distention.  She states her last bowel movement was this morning.  According to the patient's daughter in law she is unsure of the exact date of her last bowel movement.  Patient underwent CT scan in the ER was found to have dilated loops of small bowel, no significant transition point patient also with known hypokalemia and leukocytosis.  Did review the patient's CT scan laboratory studies personally.  General surgery was consulted for further evaluation and management.  Past Medical History:  Diagnosis Date   Anemia    Arthritis    Hyperlipidemia    Tubular adenoma of colon     Past Surgical History:  Procedure Laterality Date   COLONOSCOPY  08/2019   COLONOSCOPY WITH PROPOFOL N/A 02/10/2020   Procedure: COLONOSCOPY WITH PROPOFOL;  Surgeon: Meridee Score Netty Starring., MD;  Location: [facility] ENDOSCOPY;  Service: Gastroenterology;  Laterality: N/A;   COLONOSCOPY WITH PROPOFOL N/A 11/30/2020   Procedure: COLONOSCOPY WITH PROPOFOL;  Surgeon: Meridee Score Netty Starring., MD;  Location: [facility] ENDOSCOPY;  Service: Gastroenterology;  Laterality: N/A;   COLONOSCOPY WITH PROPOFOL N/A 08/16/2021   Procedure: COLONOSCOPY WITH PROPOFOL;  Surgeon: Meridee Score Netty Starring., MD;  Location: [facility] ENDOSCOPY;  Service: Gastroenterology;  Laterality: N/A;   COLONOSCOPY WITH PROPOFOL N/A 04/11/2022   Procedure: COLONOSCOPY WITH PROPOFOL;  Surgeon: Meridee Score Netty Starring., MD;  Location: [facility] ENDOSCOPY;  Service: Gastroenterology;  Laterality: N/A;   ENDOSCOPIC MUCOSAL RESECTION N/A 02/10/2020   Procedure: ENDOSCOPIC MUCOSAL RESECTION;  Surgeon: Meridee Score  Netty Starring., MD;  Location: [facility] ENDOSCOPY;  Service: Gastroenterology;  Laterality: N/A;   ENDOSCOPIC MUCOSAL RESECTION N/A 11/30/2020   Procedure: ENDOSCOPIC MUCOSAL RESECTION;  Surgeon: Meridee Score Netty Starring., MD;  Location: [facility] ENDOSCOPY;  Service: Gastroenterology;  Laterality: N/A;   ENDOSCOPIC MUCOSAL RESECTION N/A 08/16/2021   Procedure: ENDOSCOPIC MUCOSAL RESECTION;  Surgeon: Meridee Score Netty Starring., MD;  Location: [facility] ENDOSCOPY;  Service: Gastroenterology;  Laterality: N/A;   ENDOSCOPIC MUCOSAL RESECTION  04/11/2022   Procedure: ENDOSCOPIC MUCOSAL RESECTION;  Surgeon: Meridee Score Netty Starring., MD;  Location: [facility] ENDOSCOPY;  Service: Gastroenterology;;   HEMOSTASIS CLIP PLACEMENT  02/10/2020   Procedure: HEMOSTASIS CLIP PLACEMENT;  Surgeon: Lemar Lofty., MD;  Location: [facility] ENDOSCOPY;  Service: Gastroenterology;;   HEMOSTASIS CLIP PLACEMENT  11/30/2020   Procedure: HEMOSTASIS CLIP PLACEMENT;  Surgeon: Lemar Lofty., MD;  Location: [facility] ENDOSCOPY;  Service: Gastroenterology;;   HEMOSTASIS CLIP PLACEMENT  04/11/2022   Procedure: HEMOSTASIS CLIP PLACEMENT;  Surgeon: Lemar Lofty., MD;  Location: [facility] ENDOSCOPY;  Service: Gastroenterology;;   HOT HEMOSTASIS N/A 04/11/2022   Procedure: HOT HEMOSTASIS (ARGON PLASMA COAGULATION/BICAP);  Surgeon: Lemar Lofty., MD;  Location: [facility] ENDOSCOPY;  Service: Gastroenterology;  Laterality: N/A;   POLYPECTOMY  11/30/2020   Procedure: POLYPECTOMY;  Surgeon: Meridee Score Netty Starring., MD;  Location: [facility] ENDOSCOPY;  Service: Gastroenterology;;   POLYPECTOMY  08/16/2021   Procedure: POLYPECTOMY;  Surgeon: Lemar Lofty., MD;  Location: [facility] ENDOSCOPY;  Service: Gastroenterology;;   SUBMUCOSAL LIFTING INJECTION  02/10/2020   Procedure: SUBMUCOSAL LIFTING INJECTION;  Surgeon: Lemar Lofty., MD;  Location: [facility] ENDOSCOPY;  Service: Gastroenterology;;   SUBMUCOSAL LIFTING INJECTION  11/30/2020  Procedure: SUBMUCOSAL LIFTING INJECTION;   Surgeon: Meridee Score Netty Starring., MD;  Location: [facility] ENDOSCOPY;  Service: Gastroenterology;;   SUBMUCOSAL LIFTING INJECTION  08/16/2021   Procedure: SUBMUCOSAL LIFTING INJECTION;  Surgeon: Lemar Lofty., MD;  Location: [facility] ENDOSCOPY;  Service: Gastroenterology;;   TUBAL LIGATION     WISDOM TOOTH EXTRACTION      Family History  Problem Relation Age of Onset   Diabetes Sister    Breast cancer Sister        Age 13   Diabetes Brother    Colon polyps Brother    Diabetes Sister    Diabetes Brother    Breast cancer Sister    Colon cancer Neg Hx    Esophageal cancer Neg Hx    Stomach cancer Neg Hx    Pancreatic cancer Neg Hx    Liver disease Neg Hx    Inflammatory bowel disease Neg Hx    Rectal cancer Neg Hx    Social History:  reports that she has never smoked. She has never used smokeless tobacco. She reports that she does not drink alcohol and does not use drugs.  Allergies: No Known Allergies  (Not in a hospital admission)   Results for orders placed or performed during the hospital encounter of 11/20/22 (from the past 48 hour(s))  Comprehensive metabolic panel     Status: Abnormal   Collection Time: 11/20/22 11:38 AM  Result Value Ref Range   Sodium 133 (L) 135 - 145 mmol/L    Comment: ELECTROLYTES REPEATED TO VERIFY   Potassium 3.2 (L) 3.5 - 5.1 mmol/L   Chloride 90 (L) 98 - 111 mmol/L    Comment: ELECTROLYTES REPEATED TO VERIFY   CO2 20 (L) 22 - 32 mmol/L    Comment: ELECTROLYTES REPEATED TO VERIFY   Glucose, Bld 67 (L) 70 - 99 mg/dL    Comment: Glucose reference range applies only to samples taken after fasting for at least 8 hours.   BUN 10 8 - 23 mg/dL   Creatinine, Ser 1.61 0.44 - 1.00 mg/dL   Calcium 9.4 8.9 - 09.6 mg/dL   Total Protein 6.8 6.5 - 8.1 g/dL   Albumin 3.5 3.5 - 5.0 g/dL   AST 25 15 - 41 U/L   ALT 15 0 - 44 U/L   Alkaline Phosphatase 53 38 - 126 U/L   Total Bilirubin 1.3 (H) 0.3 - 1.2 mg/dL   GFR, Estimated >04 >54 mL/min    Comment:  (NOTE) Calculated using the CKD-EPI Creatinine Equation (2021)    Anion gap 23 (H) 5 - 15    Comment: ELECTROLYTES REPEATED TO VERIFY Performed at [facility], [address] [place], [place] [address]   CBC with Differential     Status: Abnormal   Collection Time: 11/20/22 11:38 AM  Result Value Ref Range   WBC 11.2 (H) 4.0 - 10.5 K/uL   RBC 4.22 3.87 - 5.11 MIL/uL   Hemoglobin 12.3 12.0 - 15.0 g/dL   HCT 91.4 78.2 - 95.6 %   MCV 89.6 80.0 - 100.0 fL   MCH 29.1 26.0 - 34.0 pg   MCHC 32.5 30.0 - 36.0 g/dL   RDW 21.3 08.6 - 57.8 %   Platelets 426 (H) 150 - 400 K/uL   nRBC 0.0 0.0 - 0.2 %   Neutrophils Relative % 81 %   Neutro Abs 9.1 (H) 1.7 - 7.7 K/uL   Lymphocytes Relative 11 %   Lymphs Abs 1.2 0.7 - 4.0 K/uL  Monocytes Relative 6 %   Monocytes Absolute 0.7 0.1 - 1.0 K/uL   Eosinophils Relative 1 %   Eosinophils Absolute 0.1 0.0 - 0.5 K/uL   Basophils Relative 0 %   Basophils Absolute 0.0 0.0 - 0.1 K/uL   Immature Granulocytes 1 %   Abs Immature Granulocytes 0.06 0.00 - 0.07 K/uL    Comment: Performed at [facility], [address] [place], [place] [address]  CBG monitoring, ED     Status: Abnormal   Collection Time: 11/20/22  2:24 PM  Result Value Ref Range   Glucose-Capillary 56 (L) 70 - 99 mg/dL    Comment: Glucose reference range applies only to samples taken after fasting for at least 8 hours.  CBG monitoring, ED     Status: None   Collection Time: 11/20/22  3:08 PM  Result Value Ref Range   Glucose-Capillary 80 70 - 99 mg/dL    Comment: Glucose reference range applies only to samples taken after fasting for at least 8 hours.   CT ABDOMEN PELVIS W CONTRAST  Result Date: 11/20/2022 CLINICAL DATA:  Concern for bowel obstruction. EXAM: CT ABDOMEN AND PELVIS WITH CONTRAST TECHNIQUE: Multidetector CT imaging of the abdomen and pelvis was performed using the standard protocol following bolus administration of intravenous  contrast. RADIATION DOSE REDUCTION: This exam was performed according to the departmental dose-optimization program which includes automated exposure control, adjustment of the mA and/or kV according to patient size and/or use of iterative reconstruction technique. CONTRAST:  OMNIPAQUE IOHEXOL 300 MG/ML  SOLN COMPARISON:  Same day abdominal radiograph and CT abdomen pelvis dated 11/08/2022. FINDINGS: Lower chest: No acute abnormality. Hepatobiliary: No suspicious focal liver abnormality is seen. No gallstones, gallbladder wall thickening, or biliary dilatation. Pancreas: Unremarkable. No pancreatic ductal dilatation or surrounding inflammatory changes. Spleen: Normal in size without focal abnormality. Adrenals/Urinary Tract: Adrenal glands are unremarkable. Kidneys are normal, without renal calculi, suspicious focal lesion, or hydronephrosis. Bladder is unremarkable. Stomach/Bowel: Stomach is within normal limits. The patient is status post a partial colectomy with ileocolic anastomosis in the right hemiabdomen. Several dilated loops of small bowel with bowel wall thickening and mucosal hyperenhancement are seen in the lower and mid abdomen, similar to 11/08/2022. There is no definite discrete transition point from dilated bowel to decompressed distal bowel. Vascular/Lymphatic: No significant vascular findings are present. No enlarged abdominal or pelvic lymph nodes. Reproductive: At least one uterine fibroid is noted. No suspicious adnexal mass. Other: No abdominal wall hernia. Small volume free intraperitoneal fluid. Musculoskeletal: Degenerative changes are seen in the spine. IMPRESSION: Several dilated loops of small bowel with bowel wall thickening and mucosal hyperenhancement are seen in the lower and mid abdomen. These findings may reflect an infectious/inflammatory process and some degree of bowel obstruction. No definite transition point to suggest high-grade bowel obstruction. Electronically Signed    By: Romona Curls M.D.   On: 11/20/2022 15:50   DG Abd 2 Views  Result Date: 11/20/2022 CLINICAL DATA:  Nausea and vomiting for several days, history of prior partial colectomy initial encounter EXAM: ABDOMEN - 2 VIEW COMPARISON:  11/08/2022 CT FINDINGS: Scattered large and small bowel gas is noted. Mildly prominent small bowel loops are noted in the central abdomen. This may represent early small bowel obstruction. No free air is noted. Postsurgical changes are noted in the right abdomen consistent with the given clinical history. No acute bony abnormality is noted. IMPRESSION: Mildly prominent small bowel loops. CT may be helpful for further evaluation.  Electronically Signed   By: Alcide Clever M.D.   On: 11/20/2022 11:04    Review of Systems  Constitutional:  Negative for chills and fever.  HENT:  Negative for ear discharge, hearing loss and sore throat.   Eyes:  Negative for discharge.  Respiratory:  Negative for cough and shortness of breath.   Cardiovascular:  Negative for chest pain and leg swelling.  Gastrointestinal:  Positive for abdominal pain, nausea and vomiting. Negative for constipation and diarrhea.  Musculoskeletal:  Negative for myalgias and neck pain.  Skin:  Negative for rash.  Allergic/Immunologic: Negative for environmental allergies.  Neurological:  Negative for dizziness and seizures.  Hematological:  Does not bruise/bleed easily.  Psychiatric/Behavioral:  Negative for suicidal ideas.   All other systems reviewed and are negative.   Blood pressure 132/65, pulse 95, temperature 97.7 F (36.5 C), resp. rate 17, weight 52 kg, SpO2 99 %. Physical Exam Constitutional:      Appearance: She is well-developed.     Comments: Conversant No acute distress  HENT:     Head: Normocephalic and atraumatic.  Eyes:     General: Lids are normal. No scleral icterus.    Pupils: Pupils are equal, round, and reactive to light.     Comments: Pupils are equal round and reactive No  lid lag Moist conjunctiva  Neck:     Thyroid: No thyromegaly.     Trachea: No tracheal tenderness.     Comments: No cervical lymphadenopathy Cardiovascular:     Rate and Rhythm: Normal rate and regular rhythm.     Heart sounds: No murmur heard. Pulmonary:     Effort: Pulmonary effort is normal.     Breath sounds: Normal breath sounds. No wheezing or rales.  Abdominal:     General: There is distension.     Tenderness: There is no abdominal tenderness. There is no guarding or rebound.     Hernia: No hernia is present.     Comments: Incisions clean dry and intact.  Musculoskeletal:     Cervical back: Normal range of motion and neck supple.  Skin:    General: Skin is warm.     Findings: No rash.     Nails: There is no clubbing.     Comments: Normal skin turgor  Neurological:     Mental Status: She is alert and oriented to person, place, and time.     Comments: Normal gait and station  Psychiatric:        Mood and Affect: Mood normal.        Thought Content: Thought content normal.        Judgment: Judgment normal.     Comments: Appropriate affect      Assessment/Plan 77 year old female postop day #25 from robotic right colectomy Small bowel obstruction versus ileus.  1.  Agree with NG tube, n.p.o., IV fluids. 2.  I discussed with the patient and the patient's daughter-in-law that hopefully this is short-lived.  We will replete patient's electrolytes. 3.  Will discuss with Dr. Maisie Fus.  Axel Filler, MD 11/20/2022, 4:46 PM

## 2022-11-20 NOTE — ED Provider Notes (Signed)
[place] [facility] DEPARTMENT AT [facility] Provider Note   CSN: [id number] Arrival date & time: 11/20/22  1006     History  Chief Complaint  Patient presents with   Emesis    Ashley Gonzalez is a 77 y.o. female.   Emesis Patient had partial colectomy on March 27.  Has had nausea vomiting bloating for the last 3 days.  Has had episodes of this on the way.  Has been doing better at times but then got worse again.  No fevers.  No real abdominal pain.  States still having bowel movements.  Had CT scan on the ninth and had been seen by surgery.  No fevers.  States she was able to eat some Jell-O without vomiting.    Past Medical History:  Diagnosis Date   Anemia    Arthritis    Hyperlipidemia    Tubular adenoma of colon     Home Medications Prior to Admission medications   Medication Sig Start Date End Date Taking? Authorizing Provider  alendronate (FOSAMAX) 70 MG tablet Take 70 mg by mouth every Friday. Take with a full glass of water on an empty stomach.    [provider]  Calcium Carb-Cholecalciferol (CALCIUM+D3) 600-800 MG-UNIT TABS Take 1 tablet by mouth in the morning and at bedtime.    [provider]  ferrous sulfate 325 (65 FE) MG tablet Take 325 mg by mouth daily with breakfast.    [provider]  Multiple Vitamin (MULTIVITAMIN) tablet Take 1 tablet by mouth daily.    [provider]  naproxen sodium (ALEVE) 220 MG tablet Take 220-440 mg by mouth daily as needed (Pain).    [provider]  rosuvastatin (CRESTOR) 20 MG tablet Take 20 mg by mouth daily.     [provider]  traMADol (ULTRAM) 50 MG tablet Take 1 tablet (50 mg total) by mouth every 6 (six) hours as needed for moderate pain or severe pain. 10/28/22   Axel Filler, MD      Allergies    Patient has no known allergies.    Review of Systems   Review of Systems  Gastrointestinal:  Positive for vomiting.    Physical Exam Updated  Vital Signs BP 122/62   Pulse 83   Temp 97.7 F (36.5 C)   Resp 17   Wt 52 kg   LMP  (LMP Unknown)   SpO2 100%   BMI 21.66 kg/m  Physical Exam Vitals and nursing note reviewed.  Cardiovascular:     Rate and Rhythm: Regular rhythm.  Abdominal:     General: There is distension.     Comments: Mild distention.  No mass palpated.  No hernia palpated.  Skin:    General: Skin is warm.  Neurological:     Mental Status: She is alert and oriented to person, place, and time.     ED Results / Procedures / Treatments   Labs (all labs ordered are listed, but only abnormal results are displayed) Labs Reviewed  COMPREHENSIVE METABOLIC PANEL - Abnormal; Notable for the following components:      Result Value   Sodium 133 (*)    Potassium 3.2 (*)    Chloride 90 (*)    CO2 20 (*)    Glucose, Bld 67 (*)    Total Bilirubin 1.3 (*)    Anion gap 23 (*)    All other components within normal limits  CBC WITH DIFFERENTIAL/PLATELET - Abnormal; Notable for the following components:  WBC 11.2 (*)    Platelets 426 (*)    Neutro Abs 9.1 (*)    All other components within normal limits  CBG MONITORING, ED - Abnormal; Notable for the following components:   Glucose-Capillary 56 (*)    All other components within normal limits  CBG MONITORING, ED    EKG None  Radiology DG Abd 2 Views  Result Date: 11/20/2022 CLINICAL DATA:  Nausea and vomiting for several days, history of prior partial colectomy initial encounter EXAM: ABDOMEN - 2 VIEW COMPARISON:  11/08/2022 CT FINDINGS: Scattered large and small bowel gas is noted. Mildly prominent small bowel loops are noted in the central abdomen. This may represent early small bowel obstruction. No free air is noted. Postsurgical changes are noted in the right abdomen consistent with the given clinical history. No acute bony abnormality is noted. IMPRESSION: Mildly prominent small bowel loops. CT may be helpful for further evaluation. Electronically  Signed   By: Alcide Clever M.D.   On: 11/20/2022 11:04    Procedures Procedures    Medications Ordered in ED Medications  ondansetron (ZOFRAN) injection 4 mg (4 mg Intravenous Given 11/20/22 1130)  sodium chloride 0.9 % bolus 1,000 mL (0 mLs Intravenous Stopped 11/20/22 1235)    ED Course/ Medical Decision Making/ A&P                             Medical Decision Making Amount and/or Complexity of Data Reviewed Labs: ordered. Radiology: ordered.  Risk Prescription drug management.   Patient with nausea vomiting some mild abdominal distention.  Has had CT scan for similar symptoms around 12 days ago.  I reviewed the report and states potentially showed some bowel obstruction.  Status been doing better after that however.  Now feeling bad again.  Will get basic blood work and give fluid along with x-ray.  Differential diagnosis includes ileus, bowel obstruction, nonspecific abdominal pain.  There was a delay in getting the blood work back however eventually did show a mild left-sided abnormalities including hypoglycemia.  Will orally treat.  If continued low may need further treatment.  CT scan pending.  Care turned over to Dr. Adela Lank.        Final Clinical Impression(s) / ED Diagnoses Final diagnoses:  Nausea and vomiting, unspecified vomiting type  Hypoglycemia    Rx / DC Orders ED Discharge Orders     None         Benjiman Core, MD 11/20/22 1513

## 2022-11-20 NOTE — ED Provider Notes (Signed)
Received patient in turnover from Dr. Rubin Payor.  Please see their note for further details of Hx, PE.  Briefly patient is a 77 y.o. female with a Emesis .  Patient has had intermittent abdominal discomfort nausea and vomiting.  Sometimes able to eat and drink sometimes not.  Had a partial colectomy done March 27 by Dr. Maisie Fus.  Plan for CT imaging.  CT scan is resulted and it is concerning for a partial small bowel obstruction.  There is also some comment about possible inflammation versus infection.  Will discuss with general surgery.  I reassessed the patient who has not had persistent nausea and vomiting here.  Is tolerated a little bit by mouth.  General surgery to admit.  The patients results and plan were reviewed and discussed.   Any x-rays performed were independently reviewed by myself.   Differential diagnosis were considered with the presenting HPI.  Medications  ondansetron (ZOFRAN) injection 4 mg (4 mg Intravenous Given 11/20/22 1130)  sodium chloride 0.9 % bolus 1,000 mL (0 mLs Intravenous Stopped 11/20/22 1235)  iohexol (OMNIPAQUE) 300 MG/ML solution 100 mL (100 mLs Intravenous Contrast Given 11/20/22 1518)  midazolam (VERSED) injection 2 mg (2 mg Intravenous Given 11/20/22 1628)    Vitals:   11/20/22 1020 11/20/22 1300 11/20/22 1400 11/20/22 1430  BP:  122/62  132/65  Pulse:  83  95  Resp:  17  17  Temp:   97.7 F (36.5 C)   TempSrc:      SpO2:  100%  99%  Weight: 52 kg       Final diagnoses:  Nausea and vomiting, unspecified vomiting type  Hypoglycemia    Admission/ observation were discussed with the admitting physician, patient and/or family and they are comfortable with the plan.      Melene Plan, DO 11/20/22 1649

## 2022-11-21 ENCOUNTER — Encounter (HOSPITAL_COMMUNITY): Payer: Self-pay | Admitting: General Surgery

## 2022-11-21 DIAGNOSIS — M1990 Unspecified osteoarthritis, unspecified site: Secondary | ICD-10-CM

## 2022-11-21 DIAGNOSIS — K9189 Other postprocedural complications and disorders of digestive system: Secondary | ICD-10-CM | POA: Diagnosis not present

## 2022-11-21 DIAGNOSIS — K56609 Unspecified intestinal obstruction, unspecified as to partial versus complete obstruction: Secondary | ICD-10-CM

## 2022-11-21 DIAGNOSIS — Z86010 Personal history of colonic polyps: Secondary | ICD-10-CM

## 2022-11-21 DIAGNOSIS — K529 Noninfective gastroenteritis and colitis, unspecified: Secondary | ICD-10-CM

## 2022-11-21 DIAGNOSIS — K567 Ileus, unspecified: Secondary | ICD-10-CM | POA: Diagnosis not present

## 2022-11-21 LAB — CBC
HCT: 36.1 % (ref 36.0–46.0)
Hemoglobin: 12.2 g/dL (ref 12.0–15.0)
MCH: 29.3 pg (ref 26.0–34.0)
MCHC: 33.8 g/dL (ref 30.0–36.0)
MCV: 86.6 fL (ref 80.0–100.0)
Platelets: 353 10*3/uL (ref 150–400)
RBC: 4.17 MIL/uL (ref 3.87–5.11)
RDW: 13.2 % (ref 11.5–15.5)
WBC: 8.1 10*3/uL (ref 4.0–10.5)
nRBC: 0 % (ref 0.0–0.2)

## 2022-11-21 LAB — BASIC METABOLIC PANEL
Anion gap: 11 (ref 5–15)
BUN: 5 mg/dL — ABNORMAL LOW (ref 8–23)
CO2: 24 mmol/L (ref 22–32)
Calcium: 8.1 mg/dL — ABNORMAL LOW (ref 8.9–10.3)
Chloride: 99 mmol/L (ref 98–111)
Creatinine, Ser: 0.55 mg/dL (ref 0.44–1.00)
GFR, Estimated: >60 mL/min (ref >60)
Glucose, Bld: 135 mg/dL — ABNORMAL HIGH (ref 70–99)
Potassium: 3.8 mmol/L (ref 3.5–5.1)
Sodium: 134 mmol/L — ABNORMAL LOW (ref 135–145)

## 2022-11-21 MED ORDER — METOCLOPRAMIDE HCL 5 MG/ML IJ SOLN
5.0000 mg | Freq: Four times a day (QID) | INTRAMUSCULAR | Status: DC
  Administered 2022-11-21 – 2022-11-22 (×4): 5 mg via INTRAVENOUS
  Filled 2022-11-21 (×4): qty 2

## 2022-11-21 NOTE — Consult Note (Addendum)
Referring Provider: Dr. Romie Levee Primary Care Physician:  Elias Else, MD (Inactive) Primary Gastroenterologist:  Dr. Meridee Score   Reason for Consultation: Small bowel inflammation, bowel obstruction  HPI: Ashley Gonzalez is a 77 y.o. female with a history of a colon polyps. She has a history of a 45 mm tubular adenomatous cecal polyp with focal high-grade dysplasia initially identified per colonoscopy in 2021 s/p EMR by Dr. Meridee Score x 3.  Her most recent colonoscopy 04/11/2022 unfortunately showed the post mucosectomy scar with a third recurrence in the cecum status post endoscopic mucosal resection which was not successfully completed, APC and clip was placed she was referred to colorectal surgery for colon resection.  She was admitted to the hospital for a planned robotic assisted laparoscopic partial colectomy by Dr. Romie Levee 10/26/2022.  Her postoperative course was uncomplicated and she was discharged home 10/28/2022.  She developed nausea and diarrhea one week post op.  CTAP was completed 11/08/2022 as ordered by her general surgeon which identified s/p partial right colectomy with right abdominal ileocolic anastomosis with abnormal appearing loops of mid to distal small bowel within the right lower quadrant and pelvis with associated mesenteric vascular congestion with concerns for possibly developing bowel obstruction.  Dr. Drexel Iha discussed the CT results with the patient and at that she denies having any nausea or abdominal pain but was passing small amounts of loose stool.  She had recurrence of nausea and she was concerned she might vomit therefore she presented to the ED 11/20/2022 for further evaluation.  Labs in the ED showed a WBC count 11.2.  Hemoglobin 12.3.  Platelet 426.  Sodium 133.  Potassium 3.2.  BUN 10.  Creatinine 0.67.  Total bili 1.3.  Alk phos 53.  AST 25.  ALT 15.  Abdominal x-ray showed mildly prominent small bowel loops.  CTAP showed several dilated loops of  small bowel with bowel wall thickening and mucosal hyperenhancement in the lower/mid abdomen concerning for infectious/inflammatory process with some degree of bowel obstruction. NG tube was placed to low intermittent suction.  She remains NPO.  A GI consult was requested for further evaluation regarding small bowel inflammation s/p right colectomy.   Currently, she denies having any nausea.  No abdominal pain.  She believes her last bowel movement was 2 days ago was nonbloody and loose.  No BM since admission.  She has not passed gas per the rectum thus far today.  She noted seeing a few black solid stools a few days after her colon surgery without recurrence.  She has remittent heartburn for the past few weeks.  He takes Aleve 200 mg 2 tabs for aches and pains twice monthly.  Decreased appetite.  She feels like she is lost a few pounds.  No known family history of IBD or colorectal cancer.   CTAP 11/08/2022 1. Patient is status post partial right colectomy with right abdominal ileocolic anastomosis. Abnormal appearing loops of mid to distal small bowel within the right lower quadrant and pelvis which demonstrate marked bowel wall thickening and associated mesenteric vascular congestion. Bowel wall thickening is somewhat greater than would be expected for postoperative edema. Some swirling appearance of the mesentery and vasculature in the right lower quadrant in the vicinity of the anastomosis, though enteral contrast migrates distal to the anastomosis and into the colon. Question findings of partial obstruction. There is no intramural air. There is no evidence for free gas to suggest anastomotic leak or perforation. 2. Moderate perihepatic free fluid. 3. Uterine  fibroids  CTAP 11/20/2022: Several dilated loops of small bowel with bowel wall thickening and mucosal hyperenhancement are seen in the lower and mid abdomen. These findings may reflect an infectious/inflammatory process and some  degree of bowel obstruction. No definite transition point to suggest high-grade bowel obstruction.   GI PROCEDURES:  Colonoscopy 04/11/2022 by Dr. Meridee Score - Hemorrhoids found on digital rectal exam.  - There was significant looping of the colon.  - The examined portion of the ileum was normal.  - Post mucosectomy scar with unfortunate 3rd recurrence in the cecum (across from the ICV). Underwater mucosal resection was not successful completely. Treated forceps avulsion. Treated majority of remaining lesion with argon plasma coagulation (APC). Clips (MR conditional) were placed.  - Normal mucosa in the entire examined colon otherwise.  - Non-bleeding non-thrombosed internal hemorrhoids. A. CECUM, POLYPOID MASS, EMR/POLYPECTOMY:  Traditional serrated adenoma with high-grade dysplasia  Negative for invasive carcinoma (see comment)   Colonoscopy 08/16/2021: - Hemorrhoids found on digital rectal exam. - The examined portion of the ileum was normal. - Post mucosectomy scar in the cecum. Snare piecemeal resection and forcep avulsion techniques used. Clips (MR conditional) were placed. - One 3 mm polyp in the ascending colon, removed with a cold snare. Resected and retrieved. - Normal mucosa in the entire examined colon otherwise. - Non-bleeding non-thrombosed external and internal hemorrhoids. A. COLON, CECAL POLYP, POLYPECTOMY:  - Tubular adenoma with focal high-grade dysplasia.  - Negative for invasive carcinoma.  B. COLON, ASCENDING POLYP, POLYPECTOMY:  - Sessile serrated polyp.  - Negative for dysplasia and malignancy.   Colonoscopy 11/30/2020: - Stool in the entire colon leading to a preparation of the colon was fair even after extensive lavage.  - Hemorrhoids found on digital rectal exam.  - The examined portion of the ileum was normal.  - One 30 mm polyp in the cecum within the previous scar site, removed with piecemeal mucosal resection and Avulsion. Resected tissue retrieved. Clips (MR  conditional) were placed.  - One 5 mm polyp in the ascending colon, removed with a cold snare. Resected and retrieved.  - Non-bleeding non-thrombosed hemorrhoids. A. COLON, CECUM, ENDOSCOPIC MUCOSAL RESECTION OF POLYP:  - Tubular adenoma with focal high-grade dysplasia.  B. COLON, ASCENDING, POLYPECTOMY:  - Tubular adenoma without high-grade dysplasia.   Colonoscopy 02/10/2020: - Hemorrhoids found on digital rectal exam.  - Tortuous colon.  - The examined portion of the ileum was normal.  - One 45 mm polyp in the cecum (spanning 50% of the cecal base, removed with mucosal resection. Resected and retrieved. Clips (MR conditional) were placed though complete closure not possible due to size of defect/resection site.  - One 20 mm polyp in the proximal ascending colon, removed with mucosal resection. Resected and retrieved. Clips (MR conditional) were placed.  - The rest of examined colon is normal appearing -  though indepth evaluation not performed due to intent of today's procedure. - Non-bleeding non-thrombosed internal hemorrhoids  Colonoscopy 08/28/2019 by Deboraha Sprang GI 4 cm lobulated cecal polyp, path report consistent with adenoma, patient referred for EMR.  Past Medical History:  Diagnosis Date   Anemia    Arthritis    Hyperlipidemia    Tubular adenoma of colon     Past Surgical History:  Procedure Laterality Date   COLONOSCOPY  08/2019   COLONOSCOPY WITH PROPOFOL N/A 02/10/2020   Procedure: COLONOSCOPY WITH PROPOFOL;  Surgeon: Meridee Score Netty Starring., MD;  Location: [facility] ENDOSCOPY;  Service: Gastroenterology;  Laterality: N/A;   COLONOSCOPY WITH  PROPOFOL N/A 11/30/2020   Procedure: COLONOSCOPY WITH PROPOFOL;  Surgeon: Meridee Score Netty Starring., MD;  Location: [facility] ENDOSCOPY;  Service: Gastroenterology;  Laterality: N/A;   COLONOSCOPY WITH PROPOFOL N/A 08/16/2021   Procedure: COLONOSCOPY WITH PROPOFOL;  Surgeon: Meridee Score Netty Starring., MD;  Location: [facility] ENDOSCOPY;  Service: Gastroenterology;   Laterality: N/A;   COLONOSCOPY WITH PROPOFOL N/A 04/11/2022   Procedure: COLONOSCOPY WITH PROPOFOL;  Surgeon: Meridee Score Netty Starring., MD;  Location: [facility] ENDOSCOPY;  Service: Gastroenterology;  Laterality: N/A;   ENDOSCOPIC MUCOSAL RESECTION N/A 02/10/2020   Procedure: ENDOSCOPIC MUCOSAL RESECTION;  Surgeon: Meridee Score Netty Starring., MD;  Location: [facility] ENDOSCOPY;  Service: Gastroenterology;  Laterality: N/A;   ENDOSCOPIC MUCOSAL RESECTION N/A 11/30/2020   Procedure: ENDOSCOPIC MUCOSAL RESECTION;  Surgeon: Meridee Score Netty Starring., MD;  Location: [facility] ENDOSCOPY;  Service: Gastroenterology;  Laterality: N/A;   ENDOSCOPIC MUCOSAL RESECTION N/A 08/16/2021   Procedure: ENDOSCOPIC MUCOSAL RESECTION;  Surgeon: Meridee Score Netty Starring., MD;  Location: [facility] ENDOSCOPY;  Service: Gastroenterology;  Laterality: N/A;   ENDOSCOPIC MUCOSAL RESECTION  04/11/2022   Procedure: ENDOSCOPIC MUCOSAL RESECTION;  Surgeon: Meridee Score Netty Starring., MD;  Location: [facility] ENDOSCOPY;  Service: Gastroenterology;;   HEMOSTASIS CLIP PLACEMENT  02/10/2020   Procedure: HEMOSTASIS CLIP PLACEMENT;  Surgeon: Lemar Lofty., MD;  Location: [facility] ENDOSCOPY;  Service: Gastroenterology;;   HEMOSTASIS CLIP PLACEMENT  11/30/2020   Procedure: HEMOSTASIS CLIP PLACEMENT;  Surgeon: Lemar Lofty., MD;  Location: [facility] ENDOSCOPY;  Service: Gastroenterology;;   HEMOSTASIS CLIP PLACEMENT  04/11/2022   Procedure: HEMOSTASIS CLIP PLACEMENT;  Surgeon: Lemar Lofty., MD;  Location: [facility] ENDOSCOPY;  Service: Gastroenterology;;   HOT HEMOSTASIS N/A 04/11/2022   Procedure: HOT HEMOSTASIS (ARGON PLASMA COAGULATION/BICAP);  Surgeon: Lemar Lofty., MD;  Location: [facility] ENDOSCOPY;  Service: Gastroenterology;  Laterality: N/A;   POLYPECTOMY  11/30/2020   Procedure: POLYPECTOMY;  Surgeon: Meridee Score Netty Starring., MD;  Location: [facility] ENDOSCOPY;  Service: Gastroenterology;;   POLYPECTOMY  08/16/2021   Procedure: POLYPECTOMY;  Surgeon: Lemar Lofty.,  MD;  Location: [facility] ENDOSCOPY;  Service: Gastroenterology;;   SUBMUCOSAL LIFTING INJECTION  02/10/2020   Procedure: SUBMUCOSAL LIFTING INJECTION;  Surgeon: Lemar Lofty., MD;  Location: [facility] ENDOSCOPY;  Service: Gastroenterology;;   SUBMUCOSAL LIFTING INJECTION  11/30/2020   Procedure: SUBMUCOSAL LIFTING INJECTION;  Surgeon: Lemar Lofty., MD;  Location: [facility] ENDOSCOPY;  Service: Gastroenterology;;   SUBMUCOSAL LIFTING INJECTION  08/16/2021   Procedure: SUBMUCOSAL LIFTING INJECTION;  Surgeon: Lemar Lofty., MD;  Location: [facility] ENDOSCOPY;  Service: Gastroenterology;;   TUBAL LIGATION     WISDOM TOOTH EXTRACTION      Prior to Admission medications   Medication Sig Start Date End Date Taking? Authorizing Provider  alendronate (FOSAMAX) 70 MG tablet Take 70 mg by mouth every Friday. Take with a full glass of water on an empty stomach.   Yes [provider]  ALEVE 220 MG tablet Take 220-440 mg by mouth daily as needed (for arthritis pain).   Yes [provider]  Calcium Carb-Cholecalciferol (CALCIUM+D3) 600-800 MG-UNIT TABS Take 1 tablet by mouth in the morning and at bedtime.   Yes [provider]  ferrous sulfate 325 (65 FE) MG tablet Take 325 mg by mouth daily with breakfast.   Yes [provider]  Multiple Vitamin (MULTIVITAMIN) tablet Take 1 tablet by mouth daily with breakfast.   Yes [provider]  rosuvastatin (CRESTOR) 20 MG tablet Take 20 mg by mouth daily.    Yes [provider]  traMADol (ULTRAM) 50 MG  tablet Take 1 tablet (50 mg total) by mouth every 6 (six) hours as needed for moderate pain or severe pain. Patient not taking: Reported on 11/20/2022 10/28/22   Axel Filler, MD    Current Facility-Administered Medications  Medication Dose Route Frequency Provider Last Rate Last Admin   dextrose 5 % and 0.9 % NaCl with KCl 40 mEq/L infusion   Intravenous Continuous Axel Filler, MD 75 mL/hr at 11/21/22 0558  New Bag at 11/21/22 0558   enoxaparin (LOVENOX) injection 40 mg  40 mg Subcutaneous QHS Axel Filler, MD   40 mg at 11/20/22 2036   metoCLOPramide (REGLAN) injection 5 mg  5 mg Intravenous Q6H Romie Levee, MD   5 mg at 11/21/22 0827   ondansetron (ZOFRAN-ODT) disintegrating tablet 4 mg  4 mg Oral Q6H PRN Axel Filler, MD       Or   ondansetron [facility]) injection 4 mg  4 mg Intravenous Q6H PRN Axel Filler, MD        Allergies as of 11/20/2022 - Review Complete 11/20/2022  Allergen Reaction Noted   Zofran [ondansetron] Nausea And Vomiting and Other (See Comments) 11/20/2022    Family History  Problem Relation Age of Onset   Diabetes Sister    Breast cancer Sister        Age 35   Diabetes Brother    Colon polyps Brother    Diabetes Sister    Diabetes Brother    Breast cancer Sister    Colon cancer Neg Hx    Esophageal cancer Neg Hx    Stomach cancer Neg Hx    Pancreatic cancer Neg Hx    Liver disease Neg Hx    Inflammatory bowel disease Neg Hx    Rectal cancer Neg Hx     Social History   Socioeconomic History   Marital status: Married    Spouse name: Not on file   Number of children: 1   Years of education: Not on file   Highest education level: Not on file  Occupational History   Not on file  Tobacco Use   Smoking status: Never   Smokeless tobacco: Never  Vaping Use   Vaping Use: Never used  Substance and Sexual Activity   Alcohol use: No   Drug use: Never   Sexual activity: Never    Birth control/protection: Surgical    Comment: Tubal Ligation  Other Topics Concern   Not on file  Social History Narrative   Not on file   Social Determinants of Health   Financial Resource Strain: Not on file  Food Insecurity: No Food Insecurity (11/20/2022)   Hunger Vital Sign    Worried About Running Out of Food in the Last Year: Never true    Ran Out of Food in the Last Year: Never true  Transportation Needs: No Transportation Needs (11/20/2022)   PRAPARE  - Administrator, Civil Service (Medical): No    Lack of Transportation (Non-Medical): No  Physical Activity: Not on file  Stress: Not on file  Social Connections: Not on file  Intimate Partner Violence: Not At Risk (11/20/2022)   Humiliation, Afraid, Rape, and Kick questionnaire    Fear of Current or Ex-Partner: No    Emotionally Abused: No    Physically Abused: No    Sexually Abused: No    Review of Systems: Gen: See HPI.   CV: Denies chest pain, palpitations or edema. Resp: Denies cough, shortness of breath of hemoptysis.  GI: See HPI.  GU : Denies urinary burning, blood in urine, increased urinary frequency or incontinence. MS: + Arthritis, back pain.  Derm: Denies rash, itchiness, skin lesions or unhealing ulcers. Psych: Denies depression, anxiety, memory loss or confusion. Heme: Denies easy bruising, bleeding. Neuro:  Denies headaches, dizziness or paresthesias. Endo:  Denies any problems with DM, thyroid or adrenal function.  Physical Exam: Vital signs in last 24 hours: Temp:  [97.5 F (36.4 C)-98.7 F (37.1 C)] 97.5 F (36.4 C) (04/22 1234) Pulse Rate:  [80-95] 95 (04/22 1234) Resp:  [16-18] 17 (04/22 1234) BP: (116-141)/(65-73) 116/65 (04/22 1234) SpO2:  [99 %-100 %] 99 % (04/22 1234) Last BM Date : 11/20/22 Wt Readings from Last 3 Encounters:  11/20/22 52 kg  10/28/22 52 kg  10/13/22 47.2 kg    General: Thin 77 year old female in no acute distress. Head:  Normocephalic and atraumatic. Eyes:  No scleral icterus. Conjunctiva pink. Ears:  Normal auditory acuity. Nose:  No deformity, discharge or lesions. Mouth:  Dentition intact. No ulcers or lesions.  Neck:  Supple. No lymphadenopathy or thyromegaly.  Lungs: Breath sounds clear throughout. No wheezes, rhonchi or crackles.  Heart: Rate rhythm, no murmurs. Abdomen: Soft, nondistended.  Nontender.  Hypoactive bowel sounds auscultated to all 4 quadrants with NG tube briefly clamped.  NG tube canister  has approximately 600 cc of brown watery drainage. Rectal: Deferred. Musculoskeletal:  Symmetrical without gross deformities.  Pulses:  Normal pulses noted. Extremities:  Without clubbing or edema. Neurologic:  Alert and  oriented x 4. No focal deficits.  Skin:  Intact without significant lesions or rashes. Psych:  Alert and cooperative. Normal mood and affect.  Intake/Output from previous day: 04/21 0701 - 04/22 0700 In: 807.5 [I.V.:787.5; NG/GT:20] Out: 400 [Urine:200; Emesis/NG output:200] Intake/Output this shift: Total I/O In: 434.1 [P.O.:60; I.V.:374.1] Out: 1450 [Urine:1050; Emesis/NG output:400]  Lab Results: Recent Labs    11/20/22 1138 11/21/22 0453  WBC 11.2* 8.1  HGB 12.3 12.2  HCT 37.8 36.1  PLT 426* 353   BMET Recent Labs    11/20/22 1138 11/21/22 0453  NA 133* 134*  K 3.2* 3.8  CL 90* 99  CO2 20* 24  GLUCOSE 67* 135*  BUN 10 5*  CREATININE 0.67 0.55  CALCIUM 9.4 8.1*   LFT Recent Labs    11/20/22 1138  PROT 6.8  ALBUMIN 3.5  AST 25  ALT 15  ALKPHOS 53  BILITOT 1.3*      Studies/Results: CT ABDOMEN PELVIS W CONTRAST  Result Date: 11/20/2022 CLINICAL DATA:  Concern for bowel obstruction. EXAM: CT ABDOMEN AND PELVIS WITH CONTRAST TECHNIQUE: Multidetector CT imaging of the abdomen and pelvis was performed using the standard protocol following bolus administration of intravenous contrast. RADIATION DOSE REDUCTION: This exam was performed according to the departmental dose-optimization program which includes automated exposure control, adjustment of the mA and/or kV according to patient size and/or use of iterative reconstruction technique. CONTRAST:  OMNIPAQUE IOHEXOL 300 MG/ML  SOLN COMPARISON:  Same day abdominal radiograph and CT abdomen pelvis dated 11/08/2022. FINDINGS: Lower chest: No acute abnormality. Hepatobiliary: No suspicious focal liver abnormality is seen. No gallstones, gallbladder wall thickening, or biliary dilatation.  Pancreas: Unremarkable. No pancreatic ductal dilatation or surrounding inflammatory changes. Spleen: Normal in size without focal abnormality. Adrenals/Urinary Tract: Adrenal glands are unremarkable. Kidneys are normal, without renal calculi, suspicious focal lesion, or hydronephrosis. Bladder is unremarkable. Stomach/Bowel: Stomach is within normal limits. The patient is status post a partial colectomy with ileocolic anastomosis in the right  hemiabdomen. Several dilated loops of small bowel with bowel wall thickening and mucosal hyperenhancement are seen in the lower and mid abdomen, similar to 11/08/2022. There is no definite discrete transition point from dilated bowel to decompressed distal bowel. Vascular/Lymphatic: No significant vascular findings are present. No enlarged abdominal or pelvic lymph nodes. Reproductive: At least one uterine fibroid is noted. No suspicious adnexal mass. Other: No abdominal wall hernia. Small volume free intraperitoneal fluid. Musculoskeletal: Degenerative changes are seen in the spine. IMPRESSION: Several dilated loops of small bowel with bowel wall thickening and mucosal hyperenhancement are seen in the lower and mid abdomen. These findings may reflect an infectious/inflammatory process and some degree of bowel obstruction. No definite transition point to suggest high-grade bowel obstruction. Electronically Signed   By: Romona Curls M.D.   On: 11/20/2022 15:50   DG Abd 2 Views  Result Date: 11/20/2022 CLINICAL DATA:  Nausea and vomiting for several days, history of prior partial colectomy initial encounter EXAM: ABDOMEN - 2 VIEW COMPARISON:  11/08/2022 CT FINDINGS: Scattered large and small bowel gas is noted. Mildly prominent small bowel loops are noted in the central abdomen. This may represent early small bowel obstruction. No free air is noted. Postsurgical changes are noted in the right abdomen consistent with the given clinical history. No acute bony abnormality is  noted. IMPRESSION: Mildly prominent small bowel loops. CT may be helpful for further evaluation. Electronically Signed   By: Alcide Clever M.D.   On: 11/20/2022 11:04    IMPRESSION/PLAN:  77 year old female with a history of a large cecal tubular adenomatous polyp with high-grade dysplasia s/p EMR x 3 from 2021 - 04/2022, subsequently underwent a  robotic right hemicolectomy 10/26/2022. Admitted to the hospital 11/20/2022 with nausea and loose stools.  No abdominal pain. CTAP showed several dilated loops of small bowel with bowel wall thickening and mucosal hyperenhancement in the lower/mid abdomen concerning for infectious/inflammatory process with some degree of bowel obstruction. An NG tube was placed to low intermittent suction.  Seen by general surgery who felt it was unlikely her small bowel inflammation was due to mesenteric ischemia and further GI evaluation was recommended. -NPO -IV fluids and pain management per the hospitalist -Ondansetron 4 mg IV every 6 hours as needed.  -Agree with trial of Reglan  IV Q 6 hours -Check C. difficile PCR if patient has diarrhea -Keep potassium level > 4 < 5 and magnesium level 2 - 2.5 -Await further recommendations per Dr. Harvest Forest Roxanna Mew  11/21/2022, 2:59PM      GI Attending   I have taken an interval history, reviewed the chart and examined the patient. I agree with the Advanced Practitioner's note, impression and recommendations + the following:  This is unusual - I am not sure what has caused her problem of thickened small bowel wall and dilation. She has an apparent enteritis + ileus.   Infection seems unlikely as no diarrhea. ? If there was some sort of transient altered blood flow in the perioperative period but her first presentation was 1 week + after dc from right hemi-colectomy so seems unlikely.   At this point agree w/ current care - NG tube and Reglan.  Will check xray tomorrow.   Will follow w/ you.  Consider colonoscopy/ileoscopy pending clinical course though the neo-terminal ileum did not seem involved so ? utility  Iva Boop, MD, [facility] Gastroenterology See Loretha Stapler on call - gastroenterology for best contact person 11/21/2022 4:14 PM

## 2022-11-21 NOTE — Plan of Care (Signed)
  Problem: Activity: Goal: Ability to tolerate increased activity will improve Outcome: Progressing   Problem: Nutritional: Goal: Will attain and maintain optimal nutritional status will improve Outcome: Progressing

## 2022-11-21 NOTE — TOC Progression Note (Signed)
Transition of Care [facility]) - Progression Note    Patient Details  Name: Ashley Gonzalez MRN: [id number] Date of Birth: Mar 29, 1946  Transition of Care [facility]) CM/SW Contact  Geni Bers, RN Phone Number: 11/21/2022, 9:09 AM  Clinical Narrative:     [name], very pleasant , lives alone and a retiree from [place]. States that she is not sure if she will need HH at discharge. TOC will continue to follow for [facility] needs.   Expected Discharge Plan: Home w Home Health Services Barriers to Discharge: No Barriers Identified  Expected Discharge Plan and Services     Post Acute Care Choice: Home Health Living arrangements for the past 2 months: Single Family Home                                       Social Determinants of Health (SDOH) Interventions SDOH Screenings   Food Insecurity: No Food Insecurity (11/20/2022)  Housing: Low Risk  (11/20/2022)  Transportation Needs: No Transportation Needs (11/20/2022)  Utilities: Not At Risk (11/20/2022)  Tobacco Use: Low Risk  (10/26/2022)    Readmission Risk Interventions     No data to display

## 2022-11-21 NOTE — Progress Notes (Signed)
Subjective: Pt ~4 wks s/p R colectomy.  Having nausea and vomiting after eating with bloating.    Objective: Vital signs in last 24 hours: Temp:  [97.7 F (36.5 C)-98.7 F (37.1 C)] 98.7 F (37.1 C) (04/22 0559) Pulse Rate:  [80-95] 95 (04/22 0559) Resp:  [16-18] 18 (04/22 0559) BP: (122-141)/(62-73) 122/68 (04/22 0559) SpO2:  [99 %-100 %] 100 % (04/22 0559) Weight:  [52 kg] 52 kg (04/21 1020)   Intake/Output from previous day: 04/21 0701 - 04/22 0700 In: 807.5 [I.V.:787.5; NG/GT:20] Out: 400 [Urine:200; Emesis/NG output:200] Intake/Output this shift: No intake/output data recorded.   General appearance: alert and cooperative GI: soft, non-distended NG with bilious output  Lab Results:  Recent Labs    11/20/22 1138 11/21/22 0453  WBC 11.2* 8.1  HGB 12.3 12.2  HCT 37.8 36.1  PLT 426* 353   BMET Recent Labs    11/20/22 1138 11/21/22 0453  NA 133* 134*  K 3.2* 3.8  CL 90* 99  CO2 20* 24  GLUCOSE 67* 135*  BUN 10 5*  CREATININE 0.67 0.55  CALCIUM 9.4 8.1*   PT/INR No results for input(s): "LABPROT", "INR" in the last 72 hours. ABG No results for input(s): "PHART", "HCO3" in the last 72 hours.  Invalid input(s): "PCO2", "PO2"  MEDS, Scheduled  enoxaparin (LOVENOX) injection  40 mg Subcutaneous QHS   metoCLOPramide (REGLAN) injection  5 mg Intravenous Q6H    Studies/Results: CT ABDOMEN PELVIS W CONTRAST  Result Date: 11/20/2022 CLINICAL DATA:  Concern for bowel obstruction. EXAM: CT ABDOMEN AND PELVIS WITH CONTRAST TECHNIQUE: Multidetector CT imaging of the abdomen and pelvis was performed using the standard protocol following bolus administration of intravenous contrast. RADIATION DOSE REDUCTION: This exam was performed according to the departmental dose-optimization program which includes automated exposure control, adjustment of the mA and/or kV according to patient size and/or use of iterative reconstruction technique. CONTRAST:  OMNIPAQUE  IOHEXOL 300 MG/ML  SOLN COMPARISON:  Same day abdominal radiograph and CT abdomen pelvis dated 11/08/2022. FINDINGS: Lower chest: No acute abnormality. Hepatobiliary: No suspicious focal liver abnormality is seen. No gallstones, gallbladder wall thickening, or biliary dilatation. Pancreas: Unremarkable. No pancreatic ductal dilatation or surrounding inflammatory changes. Spleen: Normal in size without focal abnormality. Adrenals/Urinary Tract: Adrenal glands are unremarkable. Kidneys are normal, without renal calculi, suspicious focal lesion, or hydronephrosis. Bladder is unremarkable. Stomach/Bowel: Stomach is within normal limits. The patient is status post a partial colectomy with ileocolic anastomosis in the right hemiabdomen. Several dilated loops of small bowel with bowel wall thickening and mucosal hyperenhancement are seen in the lower and mid abdomen, similar to 11/08/2022. There is no definite discrete transition point from dilated bowel to decompressed distal bowel. Vascular/Lymphatic: No significant vascular findings are present. No enlarged abdominal or pelvic lymph nodes. Reproductive: At least one uterine fibroid is noted. No suspicious adnexal mass. Other: No abdominal wall hernia. Small volume free intraperitoneal fluid. Musculoskeletal: Degenerative changes are seen in the spine. IMPRESSION: Several dilated loops of small bowel with bowel wall thickening and mucosal hyperenhancement are seen in the lower and mid abdomen. These findings may reflect an infectious/inflammatory process and some degree of bowel obstruction. No definite transition point to suggest high-grade bowel obstruction. Electronically Signed   By: Romona Curls M.D.   On: 11/20/2022 15:50   DG Abd 2 Views  Result Date: 11/20/2022 CLINICAL DATA:  Nausea and vomiting for several days, history of prior partial colectomy initial encounter EXAM: ABDOMEN - 2 VIEW COMPARISON:  11/08/2022 CT FINDINGS: Scattered large and small bowel  gas is noted. Mildly prominent small bowel loops are noted in the central abdomen. This may represent early small bowel obstruction. No free air is noted. Postsurgical changes are noted in the right abdomen consistent with the given clinical history. No acute bony abnormality is noted. IMPRESSION: Mildly prominent small bowel loops. CT may be helpful for further evaluation. Electronically Signed   By: Alcide Clever M.D.   On: 11/20/2022 11:04    Assessment: s/p  Patient Active Problem List   Diagnosis Date Noted   SBO (small bowel obstruction) 11/20/2022   Colon polyp 10/26/2022   Chronic constipation 06/10/2021   High grade dysplasia in colonic adenoma 06/10/2021   Tubular adenoma of colon 12/15/2019   Cecal polyp 12/14/2019   Abnormal colonoscopy 12/14/2019    Post op persistent small bowel inflammation causing ileus  Plan:  Will try IV reglan Consult GI  Cont NPO, NG  LOS: 1 day     .Vanita Panda, MD [facility], [place]    11/21/2022 7:43 AM

## 2022-11-22 ENCOUNTER — Inpatient Hospital Stay (HOSPITAL_COMMUNITY): Payer: No Typology Code available for payment source

## 2022-11-22 DIAGNOSIS — K56609 Unspecified intestinal obstruction, unspecified as to partial versus complete obstruction: Secondary | ICD-10-CM | POA: Diagnosis not present

## 2022-11-22 DIAGNOSIS — Z86010 Personal history of colonic polyps: Secondary | ICD-10-CM | POA: Diagnosis not present

## 2022-11-22 DIAGNOSIS — M1990 Unspecified osteoarthritis, unspecified site: Secondary | ICD-10-CM

## 2022-11-22 DIAGNOSIS — K9189 Other postprocedural complications and disorders of digestive system: Secondary | ICD-10-CM | POA: Diagnosis not present

## 2022-11-22 DIAGNOSIS — K529 Noninfective gastroenteritis and colitis, unspecified: Secondary | ICD-10-CM | POA: Diagnosis not present

## 2022-11-22 DIAGNOSIS — K567 Ileus, unspecified: Secondary | ICD-10-CM | POA: Diagnosis not present

## 2022-11-22 LAB — BASIC METABOLIC PANEL
Anion gap: 10 (ref 5–15)
BUN: <5 mg/dL — ABNORMAL LOW (ref 8–23)
CO2: 23 mmol/L (ref 22–32)
Calcium: 8.3 mg/dL — ABNORMAL LOW (ref 8.9–10.3)
Chloride: 104 mmol/L (ref 98–111)
Creatinine, Ser: 0.46 mg/dL (ref 0.44–1.00)
GFR, Estimated: >60 mL/min (ref >60)
Glucose, Bld: 142 mg/dL — ABNORMAL HIGH (ref 70–99)
Potassium: 3.7 mmol/L (ref 3.5–5.1)
Sodium: 137 mmol/L (ref 135–145)

## 2022-11-22 LAB — MAGNESIUM: Magnesium: 1.6 mg/dL — ABNORMAL LOW (ref 1.7–2.4)

## 2022-11-22 MED ORDER — MAGNESIUM SULFATE 2 GM/50ML IV SOLN
2.0000 g | Freq: Once | INTRAVENOUS | Status: AC
  Administered 2022-11-22: 2 g via INTRAVENOUS
  Filled 2022-11-22: qty 50

## 2022-11-22 MED ORDER — POTASSIUM CHLORIDE 10 MEQ/100ML IV SOLN
10.0000 meq | INTRAVENOUS | Status: AC
  Administered 2022-11-22 (×4): 10 meq via INTRAVENOUS
  Filled 2022-11-22: qty 100

## 2022-11-22 MED ORDER — METOCLOPRAMIDE HCL 5 MG/ML IJ SOLN
10.0000 mg | Freq: Four times a day (QID) | INTRAMUSCULAR | Status: DC
  Administered 2022-11-22 – 2022-11-24 (×7): 10 mg via INTRAVENOUS
  Filled 2022-11-22 (×7): qty 2

## 2022-11-22 NOTE — Progress Notes (Addendum)
[facility] Progress Note  CC:  Small bowel inflammation, bowel obstruction   Subjective: She denies having any nausea or vomiting.  No abdominal pain.  She remains NPO.  NG tube to low intermittent suction.  No chest pain or shortness of breath.  She ambulated in the room with assistance and is currently sitting up in the chair.  Family at bedside.   Objective:  Vital signs in last 24 hours: Temp:  [97.4 F (36.3 C)-98.9 F (37.2 C)] 97.4 F (36.3 C) (04/23 0531) Pulse Rate:  [95-97] 95 (04/23 0531) Resp:  [17] 17 (04/23 0531) BP: (114-121)/(65-72) 114/72 (04/23 0531) SpO2:  [99 %-100 %] 100 % (04/23 0531) Last BM Date : 11/20/22 General: 77 year old female fatigued appearing in no acute distress. Heart: Regular rate and rhythm, no murmurs.   Pulm: Breath sounds clear throughout. Abdomen: Soft, nontender.  Nondistended.  NG tube to low intermittent suction, 600 cc of brown watery drainage in the collection canister. Extremities: No edema. Neurologic:  Alert and  oriented x 4. Grossly normal neurologically. Psych:  Alert and cooperative. Normal mood and affect.  Intake/Output from previous day: 04/22 0701 - 04/23 0700 In: 2006.1 [P.O.:150; I.V.:1856.1] Out: 2250 [Urine:1650; Emesis/NG output:600] Intake/Output this shift: Total I/O In: -  Out: 350 [Emesis/NG output:350]  Lab Results: Recent Labs    11/20/22 1138 11/21/22 0453  WBC 11.2* 8.1  HGB 12.3 12.2  HCT 37.8 36.1  PLT 426* 353   BMET Recent Labs    11/20/22 1138 11/21/22 0453 11/22/22 0523  NA 133* 134* 137  K 3.2* 3.8 3.7  CL 90* 99 104  CO2 20* 24 23  GLUCOSE 67* 135* 142*  BUN 10 5* <5*  CREATININE 0.67 0.55 0.46  CALCIUM 9.4 8.1* 8.3*   LFT Recent Labs    11/20/22 1138  PROT 6.8  ALBUMIN 3.5  AST 25  ALT 15  ALKPHOS 53  BILITOT 1.3*   PT/INR No results for input(s): "LABPROT", "INR" in the last 72 hours. Hepatitis Panel No results for input(s): "HEPBSAG",  "HCVAB", "HEPAIGM", "HEPBIGM" in the last 72 hours.  CT ABDOMEN PELVIS W CONTRAST  Result Date: 11/20/2022 CLINICAL DATA:  Concern for bowel obstruction. EXAM: CT ABDOMEN AND PELVIS WITH CONTRAST TECHNIQUE: Multidetector CT imaging of the abdomen and pelvis was performed using the standard protocol following bolus administration of intravenous contrast. RADIATION DOSE REDUCTION: This exam was performed according to the departmental dose-optimization program which includes automated exposure control, adjustment of the mA and/or kV according to patient size and/or use of iterative reconstruction technique. CONTRAST:  OMNIPAQUE IOHEXOL 300 MG/ML  SOLN COMPARISON:  Same day abdominal radiograph and CT abdomen pelvis dated 11/08/2022. FINDINGS: Lower chest: No acute abnormality. Hepatobiliary: No suspicious focal liver abnormality is seen. No gallstones, gallbladder wall thickening, or biliary dilatation. Pancreas: Unremarkable. No pancreatic ductal dilatation or surrounding inflammatory changes. Spleen: Normal in size without focal abnormality. Adrenals/Urinary Tract: Adrenal glands are unremarkable. Kidneys are normal, without renal calculi, suspicious focal lesion, or hydronephrosis. Bladder is unremarkable. Stomach/Bowel: Stomach is within normal limits. The patient is status post a partial colectomy with ileocolic anastomosis in the right hemiabdomen. Several dilated loops of small bowel with bowel wall thickening and mucosal hyperenhancement are seen in the lower and mid abdomen, similar to 11/08/2022. There is no definite discrete transition point from dilated bowel to decompressed distal bowel. Vascular/Lymphatic: No significant vascular findings are present. No enlarged abdominal or pelvic lymph nodes. Reproductive: At least one  uterine fibroid is noted. No suspicious adnexal mass. Other: No abdominal wall hernia. Small volume free intraperitoneal fluid. Musculoskeletal: Degenerative changes are seen  in the spine. IMPRESSION: Several dilated loops of small bowel with bowel wall thickening and mucosal hyperenhancement are seen in the lower and mid abdomen. These findings may reflect an infectious/inflammatory process and some degree of bowel obstruction. No definite transition point to suggest high-grade bowel obstruction. Electronically Signed   By: Romona Curls M.D.   On: 11/20/2022 15:50   DG Abd 2 Views  Result Date: 11/20/2022 CLINICAL DATA:  Nausea and vomiting for several days, history of prior partial colectomy initial encounter EXAM: ABDOMEN - 2 VIEW COMPARISON:  11/08/2022 CT FINDINGS: Scattered large and small bowel gas is noted. Mildly prominent small bowel loops are noted in the central abdomen. This may represent early small bowel obstruction. No free air is noted. Postsurgical changes are noted in the right abdomen consistent with the given clinical history. No acute bony abnormality is noted. IMPRESSION: Mildly prominent small bowel loops. CT may be helpful for further evaluation. Electronically Signed   By: Alcide Clever M.D.   On: 11/20/2022 11:04    Assessment / Plan:  77 year old female with a history of a large cecal tubular adenomatous polyp with high-grade dysplasia s/p EMR x 3 from 2021 - 04/2022, subsequently underwent a  robotic right hemicolectomy 10/26/2022. Admitted to the hospital 11/20/2022 with nausea and loose stools.  No abdominal pain. CTAP showed several dilated loops of small bowel with bowel wall thickening and mucosal hyperenhancement in the lower/mid abdomen concerning for infectious/inflammatory process with some degree of bowel obstruction. An NG tube was placed to low intermittent suction.  Seen by general surgery who felt it was unlikely her small bowel inflammation was due to mesenteric ischemia. CT findings consistent with enteritis +/- ileus, etiology remains unclear.  K+ 3.7. Magnesium 1.6.  -NPO -K+ and Magnesium replacement per  general surgery  -Continue  Reglan  IV Q 6 hours  -Ambulate with physical therapy, up in chair daily and turn every 2 hours while in bed -Check C. difficile, GI pathogen panel if patient passes loose stool -Check fecal calprotectin level with next bowel movement. -Consider colonoscopy/ileoscopy during this hospitalization -May require tube feedings -Await further recommendations per Dr. Leone Payor  Principal Problem:   SBO (small bowel obstruction) Active Problems:   Arthritis     LOS: 2 days   Arnaldo Natal  11/22/2022, 10:45 AM    [facility] Attending   I have taken an interval history, reviewed the chart and examined the patient. I agree with the Advanced Practitioner's note, impression and recommendations.  She says having flatus. Mysterious small bowel inflammation (enteritis) still an issue. I know you can get a post-colectomy enteritis after colectomy for UC but that is w/ total colectomy and in setting of IBD. This situation is unusual. Continue conservative care. I am not sure scoping her would reach the involved areas so holding off.  Other things I am thinking about are a CT-enterography, empiric steroids (rather not), empiric Abx.   Repeat AXR tomorrow  Iva Boop, MD, [facility] See Loretha Stapler on call - gastroenterology for best contact person 11/22/2022 6:29 PM

## 2022-11-22 NOTE — Progress Notes (Signed)
Subjective: No nausea or pain, passing flatus.  No Bm   Objective: Vital signs in last 24 hours: Temp:  [97.4 F (36.3 C)-98.9 F (37.2 C)] 97.4 F (36.3 C) (04/23 0531) Pulse Rate:  [93-97] 95 (04/23 0531) Resp:  [17] 17 (04/23 0531) BP: (114-121)/(65-72) 114/72 (04/23 0531) SpO2:  [99 %-100 %] 100 % (04/23 0531)   Intake/Output from previous day: 04/22 0701 - 04/23 0700 In: 2006.1 [P.O.:150; I.V.:1856.1] Out: 2250 [Urine:1650; Emesis/NG output:600] Intake/Output this shift: No intake/output data recorded.   General appearance: alert and cooperative GI: soft, non-distended NG with light bilious output  Lab Results:  Recent Labs    11/20/22 1138 11/21/22 0453  WBC 11.2* 8.1  HGB 12.3 12.2  HCT 37.8 36.1  PLT 426* 353    BMET Recent Labs    11/21/22 0453 11/22/22 0523  NA 134* 137  K 3.8 3.7  CL 99 104  CO2 24 23  GLUCOSE 135* 142*  BUN 5* <5*  CREATININE 0.55 0.46  CALCIUM 8.1* 8.3*    PT/INR No results for input(s): "LABPROT", "INR" in the last 72 hours. ABG No results for input(s): "PHART", "HCO3" in the last 72 hours.  Invalid input(s): "PCO2", "PO2"  MEDS, Scheduled  enoxaparin (LOVENOX) injection  40 mg Subcutaneous QHS   metoCLOPramide (REGLAN) injection  10 mg Intravenous Q6H    Studies/Results: CT ABDOMEN PELVIS W CONTRAST  Result Date: 11/20/2022 CLINICAL DATA:  Concern for bowel obstruction. EXAM: CT ABDOMEN AND PELVIS WITH CONTRAST TECHNIQUE: Multidetector CT imaging of the abdomen and pelvis was performed using the standard protocol following bolus administration of intravenous contrast. RADIATION DOSE REDUCTION: This exam was performed according to the departmental dose-optimization program which includes automated exposure control, adjustment of the mA and/or kV according to patient size and/or use of iterative reconstruction technique. CONTRAST:  OMNIPAQUE IOHEXOL 300 MG/ML  SOLN COMPARISON:  Same day abdominal radiograph  and CT abdomen pelvis dated 11/08/2022. FINDINGS: Lower chest: No acute abnormality. Hepatobiliary: No suspicious focal liver abnormality is seen. No gallstones, gallbladder wall thickening, or biliary dilatation. Pancreas: Unremarkable. No pancreatic ductal dilatation or surrounding inflammatory changes. Spleen: Normal in size without focal abnormality. Adrenals/Urinary Tract: Adrenal glands are unremarkable. Kidneys are normal, without renal calculi, suspicious focal lesion, or hydronephrosis. Bladder is unremarkable. Stomach/Bowel: Stomach is within normal limits. The patient is status post a partial colectomy with ileocolic anastomosis in the right hemiabdomen. Several dilated loops of small bowel with bowel wall thickening and mucosal hyperenhancement are seen in the lower and mid abdomen, similar to 11/08/2022. There is no definite discrete transition point from dilated bowel to decompressed distal bowel. Vascular/Lymphatic: No significant vascular findings are present. No enlarged abdominal or pelvic lymph nodes. Reproductive: At least one uterine fibroid is noted. No suspicious adnexal mass. Other: No abdominal wall hernia. Small volume free intraperitoneal fluid. Musculoskeletal: Degenerative changes are seen in the spine. IMPRESSION: Several dilated loops of small bowel with bowel wall thickening and mucosal hyperenhancement are seen in the lower and mid abdomen. These findings may reflect an infectious/inflammatory process and some degree of bowel obstruction. No definite transition point to suggest high-grade bowel obstruction. Electronically Signed   By: Romona Curls M.D.   On: 11/20/2022 15:50   DG Abd 2 Views  Result Date: 11/20/2022 CLINICAL DATA:  Nausea and vomiting for several days, history of prior partial colectomy initial encounter EXAM: ABDOMEN - 2 VIEW COMPARISON:  11/08/2022 CT FINDINGS: Scattered large and small bowel gas is noted. Mildly  prominent small bowel loops are noted in the  central abdomen. This may represent early small bowel obstruction. No free air is noted. Postsurgical changes are noted in the right abdomen consistent with the given clinical history. No acute bony abnormality is noted. IMPRESSION: Mildly prominent small bowel loops. CT may be helpful for further evaluation. Electronically Signed   By: Alcide Clever M.D.   On: 11/20/2022 11:04    Assessment: s/p  Patient Active Problem List   Diagnosis Date Noted   Arthritis 11/21/2022   SBO (small bowel obstruction) 11/20/2022   Colon polyp 10/26/2022   Chronic constipation 06/10/2021   High grade dysplasia in colonic adenoma 06/10/2021   Tubular adenoma of colon 12/15/2019   Cecal polyp 12/14/2019   Abnormal colonoscopy 12/14/2019    Post op persistent small bowel inflammation causing ileus  Plan: AXR reviewed.  Small bowel appears unchanged Cont IV reglan, dose increased to  Appreciate GI input.  Unknown etiology of bowel inflammation Cont NPO, NG for now Mag and K replaced via IV  LOS: 2 days     .Vanita Panda, MD [facility], [place]    11/22/2022 8:34 AM

## 2022-11-23 ENCOUNTER — Inpatient Hospital Stay (HOSPITAL_COMMUNITY): Payer: No Typology Code available for payment source

## 2022-11-23 DIAGNOSIS — N289 Disorder of kidney and ureter, unspecified: Secondary | ICD-10-CM | POA: Diagnosis not present

## 2022-11-23 DIAGNOSIS — K3189 Other diseases of stomach and duodenum: Secondary | ICD-10-CM | POA: Diagnosis not present

## 2022-11-23 DIAGNOSIS — K56609 Unspecified intestinal obstruction, unspecified as to partial versus complete obstruction: Secondary | ICD-10-CM | POA: Diagnosis not present

## 2022-11-23 DIAGNOSIS — K529 Noninfective gastroenteritis and colitis, unspecified: Secondary | ICD-10-CM | POA: Diagnosis not present

## 2022-11-23 DIAGNOSIS — Z86010 Personal history of colonic polyps: Secondary | ICD-10-CM | POA: Diagnosis not present

## 2022-11-23 DIAGNOSIS — K9189 Other postprocedural complications and disorders of digestive system: Secondary | ICD-10-CM | POA: Diagnosis not present

## 2022-11-23 LAB — BASIC METABOLIC PANEL
Anion gap: 8 (ref 5–15)
BUN: 6 mg/dL — ABNORMAL LOW (ref 8–23)
CO2: 20 mmol/L — ABNORMAL LOW (ref 22–32)
Calcium: 8.5 mg/dL — ABNORMAL LOW (ref 8.9–10.3)
Chloride: 107 mmol/L (ref 98–111)
Creatinine, Ser: 0.47 mg/dL (ref 0.44–1.00)
GFR, Estimated: >60 mL/min (ref >60)
Glucose, Bld: 123 mg/dL — ABNORMAL HIGH (ref 70–99)
Potassium: 4.5 mmol/L (ref 3.5–5.1)
Sodium: 135 mmol/L (ref 135–145)

## 2022-11-23 LAB — MAGNESIUM: Magnesium: 2.2 mg/dL (ref 1.7–2.4)

## 2022-11-23 MED ORDER — BARIUM SULFATE 0.1 % PO SUSP
ORAL | Status: AC
  Filled 2022-11-23: qty 3

## 2022-11-23 MED ORDER — IOHEXOL 300 MG/ML  SOLN
100.0000 mL | Freq: Once | INTRAMUSCULAR | Status: AC | PRN
  Administered 2022-11-23: 100 mL via INTRAVENOUS

## 2022-11-23 MED ORDER — SODIUM CHLORIDE (PF) 0.9 % IJ SOLN
INTRAMUSCULAR | Status: AC
  Filled 2022-11-23: qty 50

## 2022-11-23 NOTE — Progress Notes (Addendum)
Mobility Specialist - Progress Note   11/23/22 1018  Mobility  Activity Ambulated with assistance in hallway;Ambulated with assistance to bathroom  Level of Assistance Contact guard assist, steadying assist  Assistive Device Front wheel walker  Distance Ambulated (ft) 250 ft  Activity Response Tolerated well  Mobility Referral Yes  $Mobility charge 1 Mobility   Pt received in bed and agreeable to mobility. Prior to ambulating, pt requested assistance to bathroom w/ help of nursing students for BM. Pt was MinA from sit>stand & contact during ambulation. No complaints during session. Pt to EOB for bath after session with all needs met.    [facility]

## 2022-11-23 NOTE — Progress Notes (Signed)
Patient 77 years old with chief complaint of abdominal pain with nausea and vomiting. Patient was admitting for a small bowel obstruction. Patient is alert and oriented x 4. Skin is intact and cool to touch. While giving the patient a bed bath, a small sore on the back of the left calve was notice. Nursing team was aware and alerted. Patient had a bowel movement this morning at 10:00 for the first time since 11/20/2022. Patient states that her pain is a 0 of a 0 out of 10 pain scale. IV team came and inserted a IV line in the right are for a CT scan to see if she needs a PIC line for risk for malnutrition due to not being able to eat.    Bonnetta Barry, Student Nurse

## 2022-11-23 NOTE — Consult Note (Signed)
WOC Nurse Consult Note: Reason for Consult:left posterior LE wound full thickness Wound type:chronic, nonhealing Pressure Injury POA: N/A Measurement:3cm x 2.5cm with 0.1cm depth Wound bed: red, dry Drainage (amount, consistency, odor) scant serous Periwound: dry and intact Dressing procedure/placement/frequency: The lesion is chronic, patient states greater than 5 years and has been seen by Dermatology. It may require another look by dermatology as so much time as has passed and it is similar in presentation to a carcinoma.  If you agree, please order consult post discharge. While in house, I have ordered conservative topical care.  WOC nursing team will not follow, but will remain available to this patient, the nursing and medical teams.  Please re-consult if needed.  Thank you for inviting [place] to participate in this patient's Plan of Care.  Ladona Mow, MSN, RN, CNS, GNP, Leda Min, [employer], [employer] phone:  [phone]

## 2022-11-23 NOTE — Progress Notes (Signed)
Subjective: No nausea or pain.  No Bm   Objective: Vital signs in last 24 hours: Temp:  [97.9 F (36.6 C)-98.2 F (36.8 C)] 97.9 F (36.6 C) (04/24 0534) Pulse Rate:  [88-94] 88 (04/24 0534) Resp:  [16-18] 17 (04/24 0534) BP: (107-121)/(66-76) 114/72 (04/24 0534) SpO2:  [100 %] 100 % (04/24 0534)   Intake/Output from previous day: 04/23 0701 - 04/24 0700 In: 1616.1 [P.O.:150; I.V.:1116.1; IV Piggyback:350] Out: 1500 [Urine:400; Emesis/NG output:1100] Intake/Output this shift: No intake/output data recorded.   General appearance: alert and cooperative GI: soft, non-distended NG with bilious output  Lab Results:  Recent Labs    11/20/22 1138 11/21/22 0453  WBC 11.2* 8.1  HGB 12.3 12.2  HCT 37.8 36.1  PLT 426* 353    BMET Recent Labs    11/22/22 0523 11/23/22 0450  NA 137 135  K 3.7 4.5  CL 104 107  CO2 23 20*  GLUCOSE 142* 123*  BUN <5* 6*  CREATININE 0.46 0.47  CALCIUM 8.3* 8.5*    PT/INR No results for input(s): "LABPROT", "INR" in the last 72 hours. ABG No results for input(s): "PHART", "HCO3" in the last 72 hours.  Invalid input(s): "PCO2", "PO2"  MEDS, Scheduled  enoxaparin (LOVENOX) injection  40 mg Subcutaneous QHS   metoCLOPramide (REGLAN) injection  10 mg Intravenous Q6H    Studies/Results: DG Abd 2 Views  Result Date: 11/23/2022 CLINICAL DATA:  Postoperative ileus. EXAM: ABDOMEN - 2 VIEW COMPARISON:  November 22, 2022. FINDINGS: Nasogastric tube tip is seen in expected position of proximal stomach. Stable small bowel dilatation is noted concerning for distal small bowel obstruction. No colonic dilatation is noted. IMPRESSION: Stable small bowel dilatation concerning for distal small bowel obstruction. Electronically Signed   By: Lupita Raider M.D.   On: 11/23/2022 08:46   DG Abd 2 Views  Result Date: 11/22/2022 CLINICAL DATA:  Ileus EXAM: ABDOMEN - 2 VIEW COMPARISON:  CT 11/20/2022.  X-ray 11/20/2022 FINDINGS: Chest x-ray is without  consolidation, pneumothorax or effusion. No edema. Normal cardiopericardial silhouette. Enteric tube with tip seen extending along the midbody of the stomach. Dilated loops of small bowel seen throughout the midabdomen approaching 4 cm in diameter. There is some air nondilated colon with some stool. No obvious free air on the portable semi upright view beneath the diaphragm. Degenerative changes. Film is rotated. Surgical changes in the right midabdomen. IMPRESSION: Persistent mildly dilated loops of small bowel in the midabdomen. Continued air in the colon. Differential continues to include ileus or partial obstruction. Recommend continued surveillance. Enteric tube. No acute cardiopulmonary disease Electronically Signed   By: Karen Kays M.D.   On: 11/22/2022 10:25    Assessment: s/p  Patient Active Problem List   Diagnosis Date Noted   Arthritis 11/21/2022   SBO (small bowel obstruction) 11/20/2022   Colon polyp 10/26/2022   Chronic constipation 06/10/2021   High grade dysplasia in colonic adenoma 06/10/2021   Tubular adenoma of colon 12/15/2019   Cecal polyp 12/14/2019   Abnormal colonoscopy 12/14/2019    Post op persistent small bowel inflammation causing ileus  Plan: AXR reviewed.  Small bowel appears unchanged Cont IV reglan, dose increased to  Appreciate GI input.  Unknown etiology of bowel inflammation.  Discussed with Dr Leone Payor this am.  Will get CT enterography today to evaluate further for an etiology. Cont NPO, NG for now   LOS: 3 days     .Vanita Panda, MD [facility], [place]  11/23/2022 9:19 AM

## 2022-11-23 NOTE — Progress Notes (Addendum)
[facility] Progress Note  CC:  Small bowel inflammation, bowel obstruction   Subjective: Patient passed a hard dark green stool this morning as reported by her RN. No abdominal pain. She tolerated contrast via NGT for small bowel enterography earlier this afternoon without N/V. No CP or SOB.   Objective:  Vital signs in last 24 hours: Temp:  [97.9 F (36.6 C)-98.2 F (36.8 C)] 97.9 F (36.6 C) (04/24 0534) Pulse Rate:  [88-94] 88 (04/24 0534) Resp:  [16-18] 17 (04/24 0534) BP: (107-121)/(66-76) 114/72 (04/24 0534) SpO2:  [100 %] 100 % (04/24 0534) Last BM Date : 11/20/22 General: 77 year old female in no acute distress. Heart: Regular rate and rhythm, no murmurs. Pulm: Sounds clear throughout. Abdomen: Abdomen soft, nondistended.  Nontender.  NG tube intact, RN at bedside to reconnect low intermittent suction. Extremities: No edema. Neurologic:  Alert and  oriented x 4. Grossly normal neurologically. Psych:  Alert and cooperative. Normal mood and affect.  Intake/Output from previous day: 04/23 0701 - 04/24 0700 In: 1616.1 [P.O.:150; I.V.:1116.1; IV Piggyback:350] Out: 1500 [Urine:400; Emesis/NG output:1100] Intake/Output this shift: Total I/O In: -  Out: 450 [Emesis/NG output:450]  Lab Results: Recent Labs    11/21/22 0453  WBC 8.1  HGB 12.2  HCT 36.1  PLT 353   BMET Recent Labs    11/21/22 0453 11/22/22 0523 11/23/22 0450  NA 134* 137 135  K 3.8 3.7 4.5  CL 99 104 107  CO2 24 23 20*  GLUCOSE 135* 142* 123*  BUN 5* <5* 6*  CREATININE 0.55 0.46 0.47  CALCIUM 8.1* 8.3* 8.5*   LFT No results for input(s): "PROT", "ALBUMIN", "AST", "ALT", "ALKPHOS", "BILITOT", "BILIDIR", "IBILI" in the last 72 hours. PT/INR No results for input(s): "LABPROT", "INR" in the last 72 hours. Hepatitis Panel No results for input(s): "HEPBSAG", "HCVAB", "HEPAIGM", "HEPBIGM" in the last 72 hours.  DG Abd 2 Views  Result Date: 11/23/2022 CLINICAL DATA:   Postoperative ileus. EXAM: ABDOMEN - 2 VIEW COMPARISON:  November 22, 2022. FINDINGS: Nasogastric tube tip is seen in expected position of proximal stomach. Stable small bowel dilatation is noted concerning for distal small bowel obstruction. No colonic dilatation is noted. IMPRESSION: Stable small bowel dilatation concerning for distal small bowel obstruction. Electronically Signed   By: Lupita Raider M.D.   On: 11/23/2022 08:46   DG Abd 2 Views  Result Date: 11/22/2022 CLINICAL DATA:  Ileus EXAM: ABDOMEN - 2 VIEW COMPARISON:  CT 11/20/2022.  X-ray 11/20/2022 FINDINGS: Chest x-ray is without consolidation, pneumothorax or effusion. No edema. Normal cardiopericardial silhouette. Enteric tube with tip seen extending along the midbody of the stomach. Dilated loops of small bowel seen throughout the midabdomen approaching 4 cm in diameter. There is some air nondilated colon with some stool. No obvious free air on the portable semi upright view beneath the diaphragm. Degenerative changes. Film is rotated. Surgical changes in the right midabdomen. IMPRESSION: Persistent mildly dilated loops of small bowel in the midabdomen. Continued air in the colon. Differential continues to include ileus or partial obstruction. Recommend continued surveillance. Enteric tube. No acute cardiopulmonary disease Electronically Signed   By: Karen Kays M.D.   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 Admitted to the hospital 11/20/2022 with nausea and loose stools.  No abdominal pain. CTAP showed several dilated loops of small bowel with bowel wall thickening and mucosal hyperenhancement in the lower/mid abdomen concerning for infectious/inflammatory process with some degree of bowel obstruction. An NG tube was placed to low  intermittent suction.  Seen by general surgery who felt it was unlikely her small bowel inflammation was due to mesenteric ischemia. CT findings consistent with enteritis +/- ileus, etiology remains unclear.  Abdominal xray today showed stable small bowel dilatation concerning for distal small bowel obstruction.  She passed a solid dark green stool this morning. K+ 4.5. Magnesium 2.2. -NPO -K+ and Magnesium replacement per  general surgery  -Continue Reglan  IV Q 6 hours  -Ambulate with physical therapy, up in chair daily and turn every 2 hours while in bed -Check fecal calprotectin level with next bowel movement. -May require tube feedings  -Await small bowel enterography results -Consider colonoscopy/ileoscopy during this hospitalization  Principal Problem:   SBO (small bowel obstruction) Active Problems:   Arthritis     LOS: 3 days   Ashley Gonzalez  11/23/2022, 2:53PM     [facility] Attending   I have taken an interval history, reviewed the chart and examined the patient. I agree with the Advanced Practitioner's note, impression and recommendations and the following:  CT-E - more like an ileus and not enteritis - decreased SB wall thickening  Reviewed w/ Dr. Maisie Fus - we will clamp NG tube and try some clears  She has started to ambulate w/ PT  F/u tomorrow  If she has vomiting we could try erythromycin though that is mainly a gastric motility stimulator it may be worth a try  Iva Boop, MD, [facility] See Loretha Stapler on call - gastroenterology for best contact person 11/23/2022 4:41 PM

## 2022-11-23 NOTE — Plan of Care (Signed)

## 2022-11-24 DIAGNOSIS — K56609 Unspecified intestinal obstruction, unspecified as to partial versus complete obstruction: Secondary | ICD-10-CM | POA: Diagnosis not present

## 2022-11-24 DIAGNOSIS — Z86010 Personal history of colonic polyps: Secondary | ICD-10-CM | POA: Diagnosis not present

## 2022-11-24 DIAGNOSIS — K9189 Other postprocedural complications and disorders of digestive system: Secondary | ICD-10-CM | POA: Diagnosis not present

## 2022-11-24 DIAGNOSIS — K567 Ileus, unspecified: Secondary | ICD-10-CM

## 2022-11-24 DIAGNOSIS — K529 Noninfective gastroenteritis and colitis, unspecified: Secondary | ICD-10-CM | POA: Diagnosis not present

## 2022-11-24 LAB — BASIC METABOLIC PANEL
Anion gap: 9 (ref 5–15)
BUN: <5 mg/dL — ABNORMAL LOW (ref 8–23)
CO2: 20 mmol/L — ABNORMAL LOW (ref 22–32)
Calcium: 8.4 mg/dL — ABNORMAL LOW (ref 8.9–10.3)
Chloride: 104 mmol/L (ref 98–111)
Creatinine, Ser: 0.39 mg/dL — ABNORMAL LOW (ref 0.44–1.00)
GFR, Estimated: >60 mL/min (ref >60)
Glucose, Bld: 119 mg/dL — ABNORMAL HIGH (ref 70–99)
Potassium: 4.1 mmol/L (ref 3.5–5.1)
Sodium: 133 mmol/L — ABNORMAL LOW (ref 135–145)

## 2022-11-24 LAB — CBC
HCT: 31.8 % — ABNORMAL LOW (ref 36.0–46.0)
Hemoglobin: 10.4 g/dL — ABNORMAL LOW (ref 12.0–15.0)
MCH: 29.3 pg (ref 26.0–34.0)
MCHC: 32.7 g/dL (ref 30.0–36.0)
MCV: 89.6 fL (ref 80.0–100.0)
Platelets: 367 10*3/uL (ref 150–400)
RBC: 3.55 MIL/uL — ABNORMAL LOW (ref 3.87–5.11)
RDW: 13.6 % (ref 11.5–15.5)
WBC: 6.6 10*3/uL (ref 4.0–10.5)
nRBC: 0 % (ref 0.0–0.2)

## 2022-11-24 LAB — PHOSPHORUS: Phosphorus: 1.6 mg/dL — ABNORMAL LOW (ref 2.5–4.6)

## 2022-11-24 LAB — MAGNESIUM: Magnesium: 1.9 mg/dL (ref 1.7–2.4)

## 2022-11-24 MED ORDER — ENSURE ENLIVE PO LIQD
237.0000 mL | Freq: Two times a day (BID) | ORAL | Status: DC
  Administered 2022-11-24 – 2022-11-25 (×3): 237 mL via ORAL

## 2022-11-24 MED ORDER — METOCLOPRAMIDE HCL 10 MG PO TABS
10.0000 mg | ORAL_TABLET | Freq: Three times a day (TID) | ORAL | Status: DC
  Administered 2022-11-24 – 2022-11-25 (×4): 10 mg via ORAL
  Filled 2022-11-24 (×4): qty 1

## 2022-11-24 NOTE — Progress Notes (Signed)
Mobility Specialist - Progress Note   11/24/22 1427  Mobility  Activity Ambulated with assistance in hallway;Ambulated with assistance to bathroom  Level of Assistance Standby assist, set-up cues, supervision of patient - no hands on  Assistive Device Front wheel walker  Distance Ambulated (ft) 235 ft  Activity Response Tolerated well  Mobility Referral Yes  $Mobility charge 1 Mobility   Pt received in bed and agreeable to mobility. When returning to room pt requested to use the bathroom for a BM. Nurse notified of BM. No complaints during session. Pt to recliner after session with all needs met.   [facility]

## 2022-11-24 NOTE — Progress Notes (Addendum)
[facility] Progress Note  CC:  Small bowel inflammation, bowel obstruction   Subjective:  Feeling much better today.  She has already been seen by Dr. Maisie Fus and NGT has been removed, she is sipping on full liquids.  Has passed a few loose/liquid stools.  No abdominal pain.  Objective:  Vital signs in last 24 hours: Temp:  [97.8 F (36.6 C)-98.7 F (37.1 C)] 97.8 F (36.6 C) (04/25 0528) Pulse Rate:  [87-95] 87 (04/25 0528) Resp:  [16-18] 18 (04/25 0528) BP: (111-126)/(68-71) 114/68 (04/25 0528) SpO2:  [94 %-100 %] 100 % (04/25 0528) FiO2 (%):  [0 %] 0 % (04/24 1153) Weight:  [51.1 kg] 51.1 kg (04/24 1952) Last BM Date : 11/23/22 General:  Alert, elderly, in NAD Heart:  Regular rate and rhythm; no murmurs Pulm:  CTAB.  No W/R/R. Abdomen:  Soft, non-distended.  BS present.  Non-tender.  Extremities:  Without edema. Neurologic:  Alert and oriented x 4;  grossly normal neurologically. Psych:  Alert and cooperative. Normal mood and affect.  Intake/Output from previous day: 04/24 0701 - 04/25 0700 In: 1551.6 [P.O.:240; I.V.:1231.6; NG/GT:80] Out: 850 [Urine:350; Emesis/NG output:500]  Lab Results: Recent Labs    11/24/22 0436  WBC 6.6  HGB 10.4*  HCT 31.8*  PLT 367   BMET Recent Labs    11/22/22 0523 11/23/22 0450 11/24/22 0436  NA 137 135 133*  K 3.7 4.5 4.1  CL 104 107 104  CO2 23 20* 20*  GLUCOSE 142* 123* 119*  BUN <5* 6* <5*  CREATININE 0.46 0.47 0.39*  CALCIUM 8.3* 8.5* 8.4*   CT ENTERO ABD/PELVIS W CONTAST  Result Date: 11/23/2022 CLINICAL DATA:  Evaluate enteritis. EXAM: CT ABDOMEN AND PELVIS WITH CONTRAST (ENTEROGRAPHY) TECHNIQUE: Multidetector CT of the abdomen and pelvis during bolus administration of intravenous contrast. Negative oral contrast was given. RADIATION DOSE REDUCTION: This exam was performed according to the departmental dose-optimization program which includes automated exposure control, adjustment of the mA and/or  kV according to patient size and/or use of iterative reconstruction technique. CONTRAST:  OMNIPAQUE IOHEXOL 300 MG/ML  SOLN COMPARISON:  11/20/2022 FINDINGS: Lower chest: Subpleural nodule within the right middle lobe is again seen measuring 6 mm. This is unchanged since 12/19/2019 and is compatible with a benign nodule. Small bilateral pleural effusions are noted which are new compared with the previous exam. Hepatobiliary: Unchanged cyst within posterior right lobe of liver measuring 1.1 cm. No suspicious liver lesions identified. The gallbladder appears within normal limits. No bile duct dilatation. Pancreas: No mass, inflammatory changes, or other significant abnormality. Spleen: Within normal limits in size and appearance. Adrenals/Urinary Tract: Normal adrenal glands. No kidney stones or hydronephrosis. Several too small to characterize (Bosniak class 2) kidney cysts are noted. No follow-up imaging recommended. The urinary bladder appears normal. Stomach/Bowel: There is a nasogastric tube with tip in the stomach. Status post right hemicolectomy with enterocolonic anastomosis. Anastomosis is identified within the right hemiabdomen, best seen on image 36/6. The anastomosis appears widely patent. The proximal small bowel loops have a normal caliber. The fluid-filled mid and distal small bowel are dilated measuring up to 4 cm, image 20/6. Similar caliber of the colon to the distal small bowel at the level of the anastomosis and continuing to the transverse colon. The distal transverse colon and left colon are decreased in caliber. Previous small bowel wall thickening has improved in the interval. No significant small or large bowel wall thickening or signs of inflammation. No  evidence for pneumatosis. Vascular/Lymphatic: Normal appearance of the abdominal aorta. No adenopathy. The upper abdominal vascularity including the portal vein, portal venous confluence and SMV remain patent. Reproductive: Fibroid  uterus is again seen. Possible sub mucosal fibroid or polyp identified within the endometrial cavity measuring 1.3 cm. No adnexal mass noted. Other: No free fluid or fluid collections identified. No signs of pneumoperitoneum. Musculoskeletal: No suspicious bone lesions identified. Degenerative disc disease identified. IMPRESSION: 1. Status post right hemicolectomy with enterocolonic anastomosis. The anastomosis appears widely patent. 2. The mid and distal small bowel loops are dilated measuring up to 4 cm. Similar caliber of the colon to the distal small bowel at the level of the anastomosis and continuing to the transverse colon. The distal transverse colon and left colon are decreased in caliber in the interval. Findings are favored to represent ileus. 3. No significant small or large bowel wall thickening or signs of edema/inflammation. Inflammation. No evidence for pneumatosis. 4. Small bilateral pleural effusions are new compared with the previous exam. 5. Fibroid uterus. Possible sub mucosal fibroid or polyp identified within the endometrial cavity measuring 1.3 cm. Consider further evaluation with nonemergent pelvic sonogram. Electronically Signed   By: Signa Kell M.D.   On: 11/23/2022 15:03   DG Abd 2 Views  Result Date: 11/23/2022 CLINICAL DATA:  Postoperative ileus. EXAM: ABDOMEN - 2 VIEW COMPARISON:  November 22, 2022. FINDINGS: Nasogastric tube tip is seen in expected position of proximal stomach. Stable small bowel dilatation is noted concerning for distal small bowel obstruction. No colonic dilatation is noted. IMPRESSION: Stable small bowel dilatation concerning for distal small bowel obstruction. Electronically Signed   By: Lupita Raider M.D.   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  Admitted to the hospital 11/20/2022 with nausea and loose stools.  No abdominal pain. CTAP showed several dilated loops of small bowel with bowel wall thickening and mucosal hyperenhancement in the lower/mid abdomen concerning for infectious/inflammatory process with some degree of bowel obstruction. An NG tube was placed to low intermittent suction.  Seen by general surgery who felt it was unlikely her small bowel inflammation was due to mesenteric ischemia. CT findings consistent with enteritis +/- ileus, etiology remains unclear.  Abdominal xray showed stable small bowel dilatation concerning for distal small bowel obstruction. K+ 4.1. Magnesium 1.9.  CT-E 4/24- more like an ileus and not enteritis - decreased SB wall thickening.  NGT was clamped and she tolerated that well with ice chips.  It has already been removed this AM and she has been advanced to FLD. -Reglan changed to 10 mg PO TID before meals.  -Ambulate with physical therapy, up in chair daily and turn every 2 hours while in bed. -Fecal calprotectin is pending. -GI will likely sign off.     LOS: 4 days   Princella Pellegrini. Zehr  11/24/2022, 8:50 AM     [facility] Attending   I have taken an interval history, reviewed the chart and examined the patient. I agree with the Advanced Practitioner's note, impression and recommendations.  She is much better - will sign off - plans per Dr. Maisie Fus. As far as the enteritis/ileus - cause unclear but improving w/ conservative care and unless recurrent issues I would not investigate further.  Iva Boop, MD, [facility]   Gastroenterology See Loretha Stapler on call - gastroenterology for best contact person 11/24/2022 5:34 PM

## 2022-11-24 NOTE — Progress Notes (Signed)
Subjective: Having BM's and feeling better.  Tolerating clears with tube clamped  Objective: Vital signs in last 24 hours: Temp:  [97.8 F (36.6 C)-98.7 F (37.1 C)] 97.8 F (36.6 C) (04/25 0528) Pulse Rate:  [87-95] 87 (04/25 0528) Resp:  [16-18] 18 (04/25 0528) BP: (111-126)/(68-71) 114/68 (04/25 0528) SpO2:  [94 %-100 %] 100 % (04/25 0528) FiO2 (%):  [0 %] 0 % (04/24 1153) Weight:  [51.1 kg] 51.1 kg (04/24 1952)   Intake/Output from previous day: 04/24 0701 - 04/25 0700 In: 1551.6 [P.O.:240; I.V.:1231.6; NG/GT:80] Out: 850 [Urine:350; Emesis/NG output:500] Intake/Output this shift: No intake/output data recorded.   General appearance: alert and cooperative GI: soft, non-distended   Lab Results:  Recent Labs    11/24/22 0436  WBC 6.6  HGB 10.4*  HCT 31.8*  PLT 367    BMET Recent Labs    11/23/22 0450 11/24/22 0436  NA 135 133*  K 4.5 4.1  CL 107 104  CO2 20* 20*  GLUCOSE 123* 119*  BUN 6* <5*  CREATININE 0.47 0.39*  CALCIUM 8.5* 8.4*    PT/INR No results for input(s): "LABPROT", "INR" in the last 72 hours. ABG No results for input(s): "PHART", "HCO3" in the last 72 hours.  Invalid input(s): "PCO2", "PO2"  MEDS, Scheduled  enoxaparin (LOVENOX) injection  40 mg Subcutaneous QHS   metoCLOPramide (REGLAN) injection  10 mg Intravenous Q6H    Studies/Results: CT ENTERO ABD/PELVIS W CONTAST  Result Date: 11/23/2022 CLINICAL DATA:  Evaluate enteritis. EXAM: CT ABDOMEN AND PELVIS WITH CONTRAST (ENTEROGRAPHY) TECHNIQUE: Multidetector CT of the abdomen and pelvis during bolus administration of intravenous contrast. Negative oral contrast was given. RADIATION DOSE REDUCTION: This exam was performed according to the departmental dose-optimization program which includes automated exposure control, adjustment of the mA and/or kV according to patient size and/or use of iterative reconstruction technique. CONTRAST:  OMNIPAQUE IOHEXOL 300 MG/ML  SOLN  COMPARISON:  11/20/2022 FINDINGS: Lower chest: Subpleural nodule within the right middle lobe is again seen measuring 6 mm. This is unchanged since 12/19/2019 and is compatible with a benign nodule. Small bilateral pleural effusions are noted which are new compared with the previous exam. Hepatobiliary: Unchanged cyst within posterior right lobe of liver measuring 1.1 cm. No suspicious liver lesions identified. The gallbladder appears within normal limits. No bile duct dilatation. Pancreas: No mass, inflammatory changes, or other significant abnormality. Spleen: Within normal limits in size and appearance. Adrenals/Urinary Tract: Normal adrenal glands. No kidney stones or hydronephrosis. Several too small to characterize (Bosniak class 2) kidney cysts are noted. No follow-up imaging recommended. The urinary bladder appears normal. Stomach/Bowel: There is a nasogastric tube with tip in the stomach. Status post right hemicolectomy with enterocolonic anastomosis. Anastomosis is identified within the right hemiabdomen, best seen on image 36/6. The anastomosis appears widely patent. The proximal small bowel loops have a normal caliber. The fluid-filled mid and distal small bowel are dilated measuring up to 4 cm, image 20/6. Similar caliber of the colon to the distal small bowel at the level of the anastomosis and continuing to the transverse colon. The distal transverse colon and left colon are decreased in caliber. Previous small bowel wall thickening has improved in the interval. No significant small or large bowel wall thickening or signs of inflammation. No evidence for pneumatosis. Vascular/Lymphatic: Normal appearance of the abdominal aorta. No adenopathy. The upper abdominal vascularity including the portal vein, portal venous confluence and SMV remain patent. Reproductive: Fibroid uterus is again seen. Possible sub  mucosal fibroid or polyp identified within the endometrial cavity measuring 1.3 cm. No adnexal mass  noted. Other: No free fluid or fluid collections identified. No signs of pneumoperitoneum. Musculoskeletal: No suspicious bone lesions identified. Degenerative disc disease identified. IMPRESSION: 1. Status post right hemicolectomy with enterocolonic anastomosis. The anastomosis appears widely patent. 2. The mid and distal small bowel loops are dilated measuring up to 4 cm. Similar caliber of the colon to the distal small bowel at the level of the anastomosis and continuing to the transverse colon. The distal transverse colon and left colon are decreased in caliber in the interval. Findings are favored to represent ileus. 3. No significant small or large bowel wall thickening or signs of edema/inflammation. Inflammation. No evidence for pneumatosis. 4. Small bilateral pleural effusions are new compared with the previous exam. 5. Fibroid uterus. Possible sub mucosal fibroid or polyp identified within the endometrial cavity measuring 1.3 cm. Consider further evaluation with nonemergent pelvic sonogram. Electronically Signed   By: Signa Kell M.D.   On: 11/23/2022 15:03   DG Abd 2 Views  Result Date: 11/23/2022 CLINICAL DATA:  Postoperative ileus. EXAM: ABDOMEN - 2 VIEW COMPARISON:  November 22, 2022. FINDINGS: Nasogastric tube tip is seen in expected position of proximal stomach. Stable small bowel dilatation is noted concerning for distal small bowel obstruction. No colonic dilatation is noted. IMPRESSION: Stable small bowel dilatation concerning for distal small bowel obstruction. Electronically Signed   By: Lupita Raider M.D.   On: 11/23/2022 08:46    Assessment: s/p  Patient Active Problem List   Diagnosis Date Noted   Arthritis 11/21/2022   SBO (small bowel obstruction) 11/20/2022   Colon polyp 10/26/2022   Chronic constipation 06/10/2021   High grade dysplasia in colonic adenoma 06/10/2021   Tubular adenoma of colon 12/15/2019   Cecal polyp 12/14/2019   Abnormal colonoscopy 12/14/2019     persistent post op ileus  Plan: Cont reglan, .  Will switch to PO Appreciate GI input.  Unknown etiology of bowel inflammation but appears to be resolving.   CT enterography does not show anything other than ileus. Given pt's clinical status will d/c NG and start FLD   LOS: 4 days     .Vanita Panda, MD [facility], [place]    11/24/2022 9:13 AM

## 2022-11-25 MED ORDER — METOCLOPRAMIDE HCL 10 MG PO TABS: 10.0000 mg | ORAL_TABLET | Freq: Three times a day (TID) | ORAL | 1 refills | Status: AC

## 2022-11-25 NOTE — Care Management Important Message (Signed)
Important Message  Patient Details IM Letter mailed to the Patient due to Discharge Name: Ashley Gonzalez MRN: [id number] Date of Birth: [birth year]   Medicare Important Message Given:Mailed due to discharge       Caren Macadam 11/25/2022, 1:35 PM

## 2022-11-25 NOTE — Plan of Care (Signed)
Problem: Education: Goal: Understanding of discharge needs will improve Outcome: Adequate for Discharge Goal: Verbalization of understanding of the causes of altered bowel function will improve Outcome: Adequate for Discharge   Problem: Activity: Goal: Ability to tolerate increased activity will improve Outcome: Adequate for Discharge   Problem: Bowel/Gastric: Goal: Gastrointestinal status for postoperative course will improve Outcome: Adequate for Discharge   Problem: Health Behavior/Discharge Planning: Goal: Identification of community resources to assist with postoperative recovery needs will improve Outcome: Adequate for Discharge   Problem: Nutritional: Goal: Will attain and maintain optimal nutritional status will improve Outcome: Adequate for Discharge   Problem: Clinical Measurements: Goal: Postoperative complications will be avoided or minimized Outcome: Adequate for Discharge   Problem: Respiratory: Goal: Respiratory status will improve Outcome: Adequate for Discharge   Problem: Skin Integrity: Goal: Will show signs of wound healing Outcome: Adequate for Discharge   Problem: Education: Goal: Knowledge of General Education information will improve Description: Including pain rating scale, medication(s)/side effects and non-pharmacologic comfort measures Outcome: Adequate for Discharge   Problem: Health Behavior/Discharge Planning: Goal: Ability to manage health-related needs will improve Outcome: Adequate for Discharge   Problem: Clinical Measurements: Goal: Ability to maintain clinical measurements within normal limits will improve Outcome: Adequate for Discharge Goal: Will remain free from infection Outcome: Adequate for Discharge Goal: Diagnostic test results will improve Outcome: Adequate for Discharge Goal: Respiratory complications will improve Outcome: Adequate for Discharge Goal: Cardiovascular complication will be avoided Outcome: Adequate for  Discharge   Problem: Activity: Goal: Risk for activity intolerance will decrease Outcome: Adequate for Discharge   Problem: Nutrition: Goal: Adequate nutrition will be maintained Outcome: Adequate for Discharge   Problem: Coping: Goal: Level of anxiety will decrease Outcome: Adequate for Discharge   Problem: Elimination: Goal: Will not experience complications related to bowel motility Outcome: Adequate for Discharge Goal: Will not experience complications related to urinary retention Outcome: Adequate for Discharge   Problem: Pain Managment: Goal: General experience of comfort will improve Outcome: Adequate for Discharge   Problem: Safety: Goal: Ability to remain free from injury will improve Outcome: Adequate for Discharge   Problem: Skin Integrity: Goal: Risk for impaired skin integrity will decrease Outcome: Adequate for Discharge   Problem: Education: Goal: Knowledge of General Education information will improve Description: Including pain rating scale, medication(s)/side effects and non-pharmacologic comfort measures Outcome: Adequate for Discharge   Problem: Health Behavior/Discharge Planning: Goal: Ability to manage health-related needs will improve Outcome: Adequate for Discharge   Problem: Clinical Measurements: Goal: Ability to maintain clinical measurements within normal limits will improve Outcome: Adequate for Discharge Goal: Will remain free from infection Outcome: Adequate for Discharge Goal: Diagnostic test results will improve Outcome: Adequate for Discharge Goal: Respiratory complications will improve Outcome: Adequate for Discharge Goal: Cardiovascular complication will be avoided Outcome: Adequate for Discharge   Problem: Activity: Goal: Risk for activity intolerance will decrease Outcome: Adequate for Discharge   Problem: Nutrition: Goal: Adequate nutrition will be maintained Outcome: Adequate for Discharge   Problem: Coping: Goal:  Level of anxiety will decrease Outcome: Adequate for Discharge   Problem: Elimination: Goal: Will not experience complications related to bowel motility Outcome: Adequate for Discharge Goal: Will not experience complications related to urinary retention Outcome: Adequate for Discharge   Problem: Pain Managment: Goal: General experience of comfort will improve Outcome: Adequate for Discharge   Problem: Safety: Goal: Ability to remain free from injury will improve Outcome: Adequate for Discharge   Problem: Skin Integrity: Goal: Risk for impaired skin integrity will   decrease Outcome: Adequate for Discharge   

## 2022-11-25 NOTE — Discharge Summary (Signed)
Physician Discharge Summary  Patient ID: Ashley Gonzalez MRN: [id number] DOB/AGE: 1945/11/20 77 y.o.  Admit date: 11/20/2022 Discharge date: 11/25/2022  Admission Diagnoses: SBO Discharge Diagnoses:  Principal Problem:  Ileus Active Problems:   Arthritis   Discharged Condition: good  Hospital Course: Patient was admitted to the med surg floor after presenting to the ED with SBO.  NG was placed.  CT showed small bowel inflammation.  GI was consulted.  We started motility agents given the lack of transition point.  A CT Enterography was performed which showed less bowel inflammation.  She began to have bowel function with this regimen.  He NG was removed and diet advanced slowly.  She continued to do well on PO Reglan and was discharge to home to increase her diet slowly over time.    Consults: GI  Significant Diagnostic Studies: labs: cbc, bmet and radiology: CT scan: see above  Treatments: IV hydration and motility medications   Discharge Exam: Blood pressure 108/63, pulse 90, temperature 98.1 F (36.7 C), resp. rate 18, height 5\' 1"  (1.549 m), weight 51.1 kg, SpO2 98 %. General appearance: alert and cooperative GI: soft, nondistended  Disposition: Discharge disposition: 01-Home or Self Care        Allergies as of 11/25/2022       Reactions   Zofran [ondansetron] Nausea And Vomiting, Other (See Comments)   CAUSED nausea and vomiting!! (ODT version)        Medication List     TAKE these medications    alendronate 70 MG tablet Commonly known as: FOSAMAX Take 70 mg by mouth every Friday. Take with a full glass of water on an empty stomach.   Aleve 220 MG tablet Generic drug: naproxen sodium Take 220-440 mg by mouth daily as needed (for arthritis pain).   Calcium+D3 600-20 MG-MCG Tabs Generic drug: Calcium Carb-Cholecalciferol Take 1 tablet by mouth in the morning and at bedtime.   ferrous sulfate 325 (65 FE) MG tablet Take 325 mg by mouth daily with  breakfast.   metoCLOPramide 10 MG tablet Commonly known as: REGLAN Take 1 tablet (10 mg total) by mouth 3 (three) times daily before meals.   multivitamin tablet Take 1 tablet by mouth daily with breakfast.   rosuvastatin 20 MG tablet Commonly known as: CRESTOR Take 20 mg by mouth daily.   traMADol 50 MG tablet Commonly known as: ULTRAM Take 1 tablet (50 mg total) by mouth every 6 (six) hours as needed for moderate pain or severe pain.        Follow-up Information     Romie Levee, MD. Schedule an appointment as soon as possible for a visit in 2 week(s).   Specialties: General Surgery, Colon and Rectal Surgery Contact information: [address] [place] [address] [place] [address] [phone]                 Signed: Vanita Panda 11/25/2022, 11:42 AM

## 2022-11-25 NOTE — Discharge Instructions (Signed)
Slowly advance your diet from liquids to soft foods over the next 2 weeks as you feel like it.

## 2022-11-25 NOTE — Progress Notes (Signed)
Mobility Specialist - Progress Note   11/25/22 0934  Mobility  Activity Ambulated with assistance in hallway  Level of Assistance Contact guard assist, steadying assist  Assistive Device Front wheel walker  Distance Ambulated (ft) 300 ft  Activity Response Tolerated well  Mobility Referral Yes  $Mobility charge 1 Mobility   Pt received in bed and agreed to mobility, had no issues throughout session. Pt returned to bed with all needs met.   Marilynne Halsted Mobility Specialist

## 2022-12-15 DIAGNOSIS — H524 Presbyopia: Secondary | ICD-10-CM | POA: Diagnosis not present

## 2022-12-15 DIAGNOSIS — Z135 Encounter for screening for eye and ear disorders: Secondary | ICD-10-CM | POA: Diagnosis not present

## 2022-12-15 DIAGNOSIS — H52223 Regular astigmatism, bilateral: Secondary | ICD-10-CM | POA: Diagnosis not present

## 2022-12-15 DIAGNOSIS — H5203 Hypermetropia, bilateral: Secondary | ICD-10-CM | POA: Diagnosis not present

## 2022-12-15 DIAGNOSIS — H2513 Age-related nuclear cataract, bilateral: Secondary | ICD-10-CM | POA: Diagnosis not present

## 2022-12-22 DIAGNOSIS — H52223 Regular astigmatism, bilateral: Secondary | ICD-10-CM | POA: Diagnosis not present

## 2022-12-22 DIAGNOSIS — H524 Presbyopia: Secondary | ICD-10-CM | POA: Diagnosis not present

## 2022-12-22 DIAGNOSIS — H25813 Combined forms of age-related cataract, bilateral: Secondary | ICD-10-CM | POA: Diagnosis not present

## 2022-12-22 DIAGNOSIS — H5213 Myopia, bilateral: Secondary | ICD-10-CM | POA: Diagnosis not present

## 2022-12-22 DIAGNOSIS — H35363 Drusen (degenerative) of macula, bilateral: Secondary | ICD-10-CM | POA: Diagnosis not present

## 2023-01-09 ENCOUNTER — Ambulatory Visit (INDEPENDENT_AMBULATORY_CARE_PROVIDER_SITE_OTHER): Payer: No Typology Code available for payment source | Admitting: Podiatry

## 2023-01-09 DIAGNOSIS — B351 Tinea unguium: Secondary | ICD-10-CM | POA: Diagnosis not present

## 2023-01-09 DIAGNOSIS — M79674 Pain in right toe(s): Secondary | ICD-10-CM | POA: Diagnosis not present

## 2023-01-09 DIAGNOSIS — M79675 Pain in left toe(s): Secondary | ICD-10-CM | POA: Diagnosis not present

## 2023-01-09 DIAGNOSIS — M545 Low back pain, unspecified: Secondary | ICD-10-CM | POA: Insufficient documentation

## 2023-01-09 DIAGNOSIS — E7800 Pure hypercholesterolemia, unspecified: Secondary | ICD-10-CM | POA: Insufficient documentation

## 2023-01-09 DIAGNOSIS — M810 Age-related osteoporosis without current pathological fracture: Secondary | ICD-10-CM | POA: Insufficient documentation

## 2023-01-09 DIAGNOSIS — E2839 Other primary ovarian failure: Secondary | ICD-10-CM | POA: Insufficient documentation

## 2023-01-09 DIAGNOSIS — M1991 Primary osteoarthritis, unspecified site: Secondary | ICD-10-CM | POA: Insufficient documentation

## 2023-01-09 DIAGNOSIS — E785 Hyperlipidemia, unspecified: Secondary | ICD-10-CM | POA: Insufficient documentation

## 2023-01-09 NOTE — Progress Notes (Signed)
       Subjective:  Patient ID: Ashley Gonzalez, female    DOB: Nov 18, 1945,  MRN: [id number]   Ashley Gonzalez presents to clinic today for:  Chief Complaint  Patient presents with   Nail Problem    RM 12 RFC bilateral nail trim 2-5.   Marland Kitchen Patient notes nails are thick, discolored, elongated and painful in shoegear when trying to ambulate.  Patient has been trimming her own toenails until now.  She states that the second toenails are getting thicker and more difficult to trim.  She moisturizes her skin of her legs and feet daily.  Denies any itching burning or numbness.  PCP is Elias Else, MD (Inactive).  Allergies  Allergen Reactions   Zofran [Ondansetron] Nausea And Vomiting and Other (See Comments)    CAUSED nausea and vomiting!! (ODT version)    Review of Systems: Negative except as noted in the HPI.  Objective:  There were no vitals filed for this visit.  Ashley Gonzalez is a pleasant 77 y.o. female in NAD. AAO x 3.  Vascular Examination: Capillary refill time is 3-5 seconds to toes bilateral. Palpable pedal pulses b/l LE. Digital hair present b/l. No pedal edema b/l. Skin temperature gradient WNL b/l. No varicosities b/l. No cyanosis or clubbing noted b/l.   Dermatological Examination: Pedal skin with normal turgor, texture and tone b/l. No open wounds. No interdigital macerations b/l. Toenails x 8 are 3mm thick, discolored, dystrophic with subungual debris. There is pain with compression of the nail plates.  They are elongated x 8.  She has had previous removal of bilateral hallux toenails  Neurological Examination: Protective sensation intact bilateral LE.   Musculoskeletal Examination: Muscle strength 5/5 to all LE muscle groups b/l.   Assessment/Plan: 1. Pain due to onychomycosis of toenails of both feet     The mycotic toenails were sharply debrided x 8 with sterile nail nippers and a power debriding burr to decrease bulk/thickness and length.    Return in  about 3 months (around 04/11/2023) for RFC.   Clerance Lav, DPM, FACFAS [facility]     [address] [place], [place] [address]                Office [phone]  Fax [phone]

## 2023-01-17 DIAGNOSIS — H25811 Combined forms of age-related cataract, right eye: Secondary | ICD-10-CM | POA: Diagnosis not present

## 2023-02-28 DIAGNOSIS — H25811 Combined forms of age-related cataract, right eye: Secondary | ICD-10-CM | POA: Diagnosis not present

## 2023-02-28 DIAGNOSIS — H268 Other specified cataract: Secondary | ICD-10-CM | POA: Diagnosis not present

## 2023-03-28 DIAGNOSIS — H25812 Combined forms of age-related cataract, left eye: Secondary | ICD-10-CM | POA: Diagnosis not present

## 2023-03-28 DIAGNOSIS — H268 Other specified cataract: Secondary | ICD-10-CM | POA: Diagnosis not present

## 2023-04-12 DIAGNOSIS — M810 Age-related osteoporosis without current pathological fracture: Secondary | ICD-10-CM | POA: Diagnosis not present

## 2023-04-12 DIAGNOSIS — M1990 Unspecified osteoarthritis, unspecified site: Secondary | ICD-10-CM | POA: Diagnosis not present

## 2023-04-12 DIAGNOSIS — Z008 Encounter for other general examination: Secondary | ICD-10-CM | POA: Diagnosis not present

## 2023-04-12 DIAGNOSIS — E785 Hyperlipidemia, unspecified: Secondary | ICD-10-CM | POA: Diagnosis not present

## 2023-04-12 DIAGNOSIS — Z6821 Body mass index (BMI) 21.0-21.9, adult: Secondary | ICD-10-CM | POA: Diagnosis not present

## 2023-04-18 ENCOUNTER — Other Ambulatory Visit: Payer: Self-pay | Admitting: Family Medicine

## 2023-04-18 DIAGNOSIS — Z1231 Encounter for screening mammogram for malignant neoplasm of breast: Secondary | ICD-10-CM

## 2023-05-05 DIAGNOSIS — Z0000 Encounter for general adult medical examination without abnormal findings: Secondary | ICD-10-CM | POA: Diagnosis not present

## 2023-05-05 DIAGNOSIS — M810 Age-related osteoporosis without current pathological fracture: Secondary | ICD-10-CM | POA: Diagnosis not present

## 2023-05-05 DIAGNOSIS — H6121 Impacted cerumen, right ear: Secondary | ICD-10-CM | POA: Diagnosis not present

## 2023-05-05 DIAGNOSIS — Z1389 Encounter for screening for other disorder: Secondary | ICD-10-CM | POA: Diagnosis not present

## 2023-05-05 DIAGNOSIS — M25552 Pain in left hip: Secondary | ICD-10-CM | POA: Diagnosis not present

## 2023-05-05 DIAGNOSIS — E2839 Other primary ovarian failure: Secondary | ICD-10-CM | POA: Diagnosis not present

## 2023-05-05 DIAGNOSIS — M1991 Primary osteoarthritis, unspecified site: Secondary | ICD-10-CM | POA: Diagnosis not present

## 2023-05-05 DIAGNOSIS — E7800 Pure hypercholesterolemia, unspecified: Secondary | ICD-10-CM | POA: Diagnosis not present

## 2023-05-17 ENCOUNTER — Ambulatory Visit
Admission: RE | Admit: 2023-05-17 | Discharge: 2023-05-17 | Disposition: A | Discharge status: Home / Self Care | Payer: No Typology Code available for payment source | Source: Ambulatory Visit | Attending: Family Medicine | Admitting: Family Medicine

## 2023-05-17 DIAGNOSIS — Z1231 Encounter for screening mammogram for malignant neoplasm of breast: Secondary | ICD-10-CM

## 2023-05-31 DIAGNOSIS — D492 Neoplasm of unspecified behavior of bone, soft tissue, and skin: Secondary | ICD-10-CM | POA: Diagnosis not present

## 2023-06-19 DIAGNOSIS — C44719 Basal cell carcinoma of skin of left lower limb, including hip: Secondary | ICD-10-CM | POA: Diagnosis not present

## 2023-07-03 DIAGNOSIS — D2371 Other benign neoplasm of skin of right lower limb, including hip: Secondary | ICD-10-CM | POA: Diagnosis not present

## 2023-07-03 DIAGNOSIS — C44729 Squamous cell carcinoma of skin of left lower limb, including hip: Secondary | ICD-10-CM | POA: Diagnosis not present

## 2023-07-24 DIAGNOSIS — Z48817 Encounter for surgical aftercare following surgery on the skin and subcutaneous tissue: Secondary | ICD-10-CM | POA: Diagnosis not present

## 2023-07-31 DIAGNOSIS — L928 Other granulomatous disorders of the skin and subcutaneous tissue: Secondary | ICD-10-CM | POA: Diagnosis not present

## 2023-07-31 DIAGNOSIS — Z48817 Encounter for surgical aftercare following surgery on the skin and subcutaneous tissue: Secondary | ICD-10-CM | POA: Diagnosis not present

## 2023-08-14 DIAGNOSIS — Z48817 Encounter for surgical aftercare following surgery on the skin and subcutaneous tissue: Secondary | ICD-10-CM | POA: Diagnosis not present

## 2023-08-31 DIAGNOSIS — L814 Other melanin hyperpigmentation: Secondary | ICD-10-CM | POA: Diagnosis not present

## 2023-08-31 DIAGNOSIS — D225 Melanocytic nevi of trunk: Secondary | ICD-10-CM | POA: Diagnosis not present

## 2023-08-31 DIAGNOSIS — L821 Other seborrheic keratosis: Secondary | ICD-10-CM | POA: Diagnosis not present

## 2023-08-31 DIAGNOSIS — L853 Xerosis cutis: Secondary | ICD-10-CM | POA: Diagnosis not present

## 2023-10-11 DIAGNOSIS — Z48817 Encounter for surgical aftercare following surgery on the skin and subcutaneous tissue: Secondary | ICD-10-CM | POA: Diagnosis not present

## 2023-12-30 DIAGNOSIS — E7800 Pure hypercholesterolemia, unspecified: Secondary | ICD-10-CM | POA: Diagnosis not present

## 2023-12-30 DIAGNOSIS — M1991 Primary osteoarthritis, unspecified site: Secondary | ICD-10-CM | POA: Diagnosis not present

## 2023-12-30 DIAGNOSIS — M810 Age-related osteoporosis without current pathological fracture: Secondary | ICD-10-CM | POA: Diagnosis not present

## 2024-01-24 DIAGNOSIS — Z008 Encounter for other general examination: Secondary | ICD-10-CM | POA: Diagnosis not present

## 2024-01-24 DIAGNOSIS — E785 Hyperlipidemia, unspecified: Secondary | ICD-10-CM | POA: Diagnosis not present

## 2024-01-29 DIAGNOSIS — M810 Age-related osteoporosis without current pathological fracture: Secondary | ICD-10-CM | POA: Diagnosis not present

## 2024-01-29 DIAGNOSIS — M1991 Primary osteoarthritis, unspecified site: Secondary | ICD-10-CM | POA: Diagnosis not present

## 2024-01-29 DIAGNOSIS — E7800 Pure hypercholesterolemia, unspecified: Secondary | ICD-10-CM | POA: Diagnosis not present

## 2024-02-21 DIAGNOSIS — H5203 Hypermetropia, bilateral: Secondary | ICD-10-CM | POA: Diagnosis not present

## 2024-02-21 DIAGNOSIS — H52223 Regular astigmatism, bilateral: Secondary | ICD-10-CM | POA: Diagnosis not present

## 2024-02-21 DIAGNOSIS — Z961 Presence of intraocular lens: Secondary | ICD-10-CM | POA: Diagnosis not present

## 2024-02-21 DIAGNOSIS — H524 Presbyopia: Secondary | ICD-10-CM | POA: Diagnosis not present

## 2024-02-28 DIAGNOSIS — D225 Melanocytic nevi of trunk: Secondary | ICD-10-CM | POA: Diagnosis not present

## 2024-02-28 DIAGNOSIS — Z08 Encounter for follow-up examination after completed treatment for malignant neoplasm: Secondary | ICD-10-CM | POA: Diagnosis not present

## 2024-02-28 DIAGNOSIS — L815 Leukoderma, not elsewhere classified: Secondary | ICD-10-CM | POA: Diagnosis not present

## 2024-02-28 DIAGNOSIS — Z85828 Personal history of other malignant neoplasm of skin: Secondary | ICD-10-CM | POA: Diagnosis not present

## 2024-02-28 DIAGNOSIS — L578 Other skin changes due to chronic exposure to nonionizing radiation: Secondary | ICD-10-CM | POA: Diagnosis not present

## 2024-02-29 DIAGNOSIS — E7800 Pure hypercholesterolemia, unspecified: Secondary | ICD-10-CM | POA: Diagnosis not present

## 2024-02-29 DIAGNOSIS — M1991 Primary osteoarthritis, unspecified site: Secondary | ICD-10-CM | POA: Diagnosis not present

## 2024-02-29 DIAGNOSIS — M810 Age-related osteoporosis without current pathological fracture: Secondary | ICD-10-CM | POA: Diagnosis not present

## 2024-03-31 DIAGNOSIS — E7800 Pure hypercholesterolemia, unspecified: Secondary | ICD-10-CM | POA: Diagnosis not present

## 2024-03-31 DIAGNOSIS — M1991 Primary osteoarthritis, unspecified site: Secondary | ICD-10-CM | POA: Diagnosis not present

## 2024-03-31 DIAGNOSIS — M810 Age-related osteoporosis without current pathological fracture: Secondary | ICD-10-CM | POA: Diagnosis not present

## 2024-04-19 ENCOUNTER — Other Ambulatory Visit: Payer: Self-pay | Admitting: Family Medicine

## 2024-04-19 DIAGNOSIS — Z1231 Encounter for screening mammogram for malignant neoplasm of breast: Secondary | ICD-10-CM

## 2024-04-30 DIAGNOSIS — E7800 Pure hypercholesterolemia, unspecified: Secondary | ICD-10-CM | POA: Diagnosis not present

## 2024-04-30 DIAGNOSIS — M810 Age-related osteoporosis without current pathological fracture: Secondary | ICD-10-CM | POA: Diagnosis not present

## 2024-04-30 DIAGNOSIS — M1991 Primary osteoarthritis, unspecified site: Secondary | ICD-10-CM | POA: Diagnosis not present

## 2024-05-07 DIAGNOSIS — Z1331 Encounter for screening for depression: Secondary | ICD-10-CM | POA: Diagnosis not present

## 2024-05-07 DIAGNOSIS — R319 Hematuria, unspecified: Secondary | ICD-10-CM | POA: Diagnosis not present

## 2024-05-07 DIAGNOSIS — E785 Hyperlipidemia, unspecified: Secondary | ICD-10-CM | POA: Diagnosis not present

## 2024-05-07 DIAGNOSIS — Z0000 Encounter for general adult medical examination without abnormal findings: Secondary | ICD-10-CM | POA: Diagnosis not present

## 2024-05-07 DIAGNOSIS — M810 Age-related osteoporosis without current pathological fracture: Secondary | ICD-10-CM | POA: Diagnosis not present

## 2024-05-09 DIAGNOSIS — E785 Hyperlipidemia, unspecified: Secondary | ICD-10-CM | POA: Diagnosis not present

## 2024-05-09 DIAGNOSIS — Z0000 Encounter for general adult medical examination without abnormal findings: Secondary | ICD-10-CM | POA: Diagnosis not present

## 2024-05-21 ENCOUNTER — Ambulatory Visit
Admission: RE | Admit: 2024-05-21 | Discharge: 2024-05-21 | Disposition: A | Source: Ambulatory Visit | Attending: Family Medicine | Admitting: Family Medicine

## 2024-05-21 DIAGNOSIS — Z1231 Encounter for screening mammogram for malignant neoplasm of breast: Secondary | ICD-10-CM

## 2024-05-24 ENCOUNTER — Other Ambulatory Visit: Payer: Self-pay | Admitting: Family Medicine

## 2024-05-24 DIAGNOSIS — R928 Other abnormal and inconclusive findings on diagnostic imaging of breast: Secondary | ICD-10-CM

## 2024-06-04 ENCOUNTER — Ambulatory Visit

## 2024-06-04 ENCOUNTER — Ambulatory Visit
Admission: RE | Admit: 2024-06-04 | Discharge: 2024-06-04 | Disposition: A | Discharge status: Home / Self Care | Source: Ambulatory Visit | Attending: Family Medicine | Admitting: Family Medicine

## 2024-06-04 DIAGNOSIS — R928 Other abnormal and inconclusive findings on diagnostic imaging of breast: Secondary | ICD-10-CM
